# Patient Record
Sex: Female | Born: 1980 | Race: White | Hispanic: No | Marital: Married | Smoking: Current every day smoker
Health system: Southern US, Community
[De-identification: ages and names within clinical notes are randomized; demographics above are authoritative.]

## PROBLEM LIST (undated history)

## (undated) HISTORY — PX: NO PAST SURGERIES: SHX2092

---

## 2010-03-24 NOTE — L&D Delivery Note (Signed)
Delivery Note At  a viable female was delivered via  (Presentation: ROA  ).  Cord was clamped and cut and infant was placed on mother's abdomen.  Cord blood was sampled. APGAR: 9,9 ; weight .5lb13oz   Placenta status: , .  Good uterine firming with fundal massage and pitocin.  Cord:  with the following complications: .   Anesthesia: none   Episiotomy: none Lacerations:   superficial, hemostatic Est. Blood Loss (mL):  Mom to postpartum.  Baby to nursery-stable.  Lindaann Slough MD 11/01/2010, 3:21 PM

## 2010-08-02 ENCOUNTER — Other Ambulatory Visit: Payer: Self-pay | Admitting: Family

## 2010-08-02 ENCOUNTER — Other Ambulatory Visit: Payer: Self-pay | Admitting: Obstetrics & Gynecology

## 2010-08-02 ENCOUNTER — Encounter (INDEPENDENT_AMBULATORY_CARE_PROVIDER_SITE_OTHER): Payer: Medicaid Other

## 2010-08-02 ENCOUNTER — Other Ambulatory Visit (HOSPITAL_COMMUNITY)
Admission: RE | Admit: 2010-08-02 | Discharge: 2010-08-02 | Disposition: A | Discharge status: Home / Self Care | Payer: Medicaid Other | Source: Ambulatory Visit | Attending: Obstetrics & Gynecology | Admitting: Obstetrics & Gynecology

## 2010-08-02 DIAGNOSIS — Z3480 Encounter for supervision of other normal pregnancy, unspecified trimester: Secondary | ICD-10-CM | POA: Insufficient documentation

## 2010-08-02 DIAGNOSIS — Z113 Encounter for screening for infections with a predominantly sexual mode of transmission: Secondary | ICD-10-CM | POA: Insufficient documentation

## 2010-08-02 DIAGNOSIS — O0930 Supervision of pregnancy with insufficient antenatal care, unspecified trimester: Secondary | ICD-10-CM

## 2010-08-02 DIAGNOSIS — Z3689 Encounter for other specified antenatal screening: Secondary | ICD-10-CM

## 2010-08-02 LAB — ANTIBODY SCREEN
Antibody Screen: NEGATIVE
Antibody Screen: POSITIVE

## 2010-08-02 LAB — HIV ANTIBODY (ROUTINE TESTING W REFLEX): HIV: NONREACTIVE

## 2010-08-02 LAB — HEPATITIS B SURFACE ANTIGEN: Hepatitis B Surface Ag: NEGATIVE

## 2010-08-06 ENCOUNTER — Ambulatory Visit (HOSPITAL_COMMUNITY)
Admission: RE | Admit: 2010-08-06 | Discharge: 2010-08-06 | Disposition: A | Discharge status: Home / Self Care | Payer: Medicaid Other | Source: Ambulatory Visit | Attending: Obstetrics & Gynecology | Admitting: Obstetrics & Gynecology

## 2010-08-06 DIAGNOSIS — O0930 Supervision of pregnancy with insufficient antenatal care, unspecified trimester: Secondary | ICD-10-CM

## 2010-08-06 DIAGNOSIS — Z3689 Encounter for other specified antenatal screening: Secondary | ICD-10-CM

## 2010-08-16 ENCOUNTER — Encounter (INDEPENDENT_AMBULATORY_CARE_PROVIDER_SITE_OTHER): Payer: Medicaid Other

## 2010-08-16 DIAGNOSIS — O0930 Supervision of pregnancy with insufficient antenatal care, unspecified trimester: Secondary | ICD-10-CM

## 2010-08-30 ENCOUNTER — Encounter (INDEPENDENT_AMBULATORY_CARE_PROVIDER_SITE_OTHER): Payer: Medicaid Other

## 2010-08-30 DIAGNOSIS — O0930 Supervision of pregnancy with insufficient antenatal care, unspecified trimester: Secondary | ICD-10-CM

## 2010-09-13 ENCOUNTER — Encounter (INDEPENDENT_AMBULATORY_CARE_PROVIDER_SITE_OTHER): Payer: Medicaid Other

## 2010-09-13 DIAGNOSIS — Z3480 Encounter for supervision of other normal pregnancy, unspecified trimester: Secondary | ICD-10-CM

## 2010-09-27 ENCOUNTER — Encounter (INDEPENDENT_AMBULATORY_CARE_PROVIDER_SITE_OTHER): Payer: Medicaid Other

## 2010-09-27 ENCOUNTER — Other Ambulatory Visit: Payer: Self-pay | Admitting: Obstetrics & Gynecology

## 2010-09-27 DIAGNOSIS — Z3480 Encounter for supervision of other normal pregnancy, unspecified trimester: Secondary | ICD-10-CM

## 2010-09-27 DIAGNOSIS — O3660X0 Maternal care for excessive fetal growth, unspecified trimester, not applicable or unspecified: Secondary | ICD-10-CM

## 2010-10-01 ENCOUNTER — Ambulatory Visit (HOSPITAL_COMMUNITY)
Admission: RE | Admit: 2010-10-01 | Discharge: 2010-10-01 | Disposition: A | Discharge status: Home / Self Care | Payer: Medicaid Other | Source: Ambulatory Visit | Attending: Obstetrics & Gynecology | Admitting: Obstetrics & Gynecology

## 2010-10-01 ENCOUNTER — Encounter (HOSPITAL_COMMUNITY): Payer: Self-pay

## 2010-10-01 DIAGNOSIS — O99330 Smoking (tobacco) complicating pregnancy, unspecified trimester: Secondary | ICD-10-CM | POA: Insufficient documentation

## 2010-10-01 DIAGNOSIS — O0930 Supervision of pregnancy with insufficient antenatal care, unspecified trimester: Secondary | ICD-10-CM | POA: Insufficient documentation

## 2010-10-01 DIAGNOSIS — O365990 Maternal care for other known or suspected poor fetal growth, unspecified trimester, not applicable or unspecified: Secondary | ICD-10-CM | POA: Insufficient documentation

## 2010-10-01 DIAGNOSIS — O3660X0 Maternal care for excessive fetal growth, unspecified trimester, not applicable or unspecified: Secondary | ICD-10-CM

## 2010-10-11 ENCOUNTER — Other Ambulatory Visit: Payer: Self-pay | Admitting: Obstetrics and Gynecology

## 2010-10-11 ENCOUNTER — Encounter (INDEPENDENT_AMBULATORY_CARE_PROVIDER_SITE_OTHER): Payer: Medicaid Other

## 2010-10-11 DIAGNOSIS — Z3480 Encounter for supervision of other normal pregnancy, unspecified trimester: Secondary | ICD-10-CM

## 2010-10-12 LAB — GC/CHLAMYDIA PROBE AMP, GENITAL: GC Probe Amp, Genital: NEGATIVE

## 2010-10-13 LAB — STREP B DNA PROBE: GBSP: NEGATIVE

## 2010-10-18 ENCOUNTER — Encounter (INDEPENDENT_AMBULATORY_CARE_PROVIDER_SITE_OTHER): Payer: Medicaid Other

## 2010-10-18 DIAGNOSIS — Z3480 Encounter for supervision of other normal pregnancy, unspecified trimester: Secondary | ICD-10-CM

## 2010-10-25 ENCOUNTER — Encounter (INDEPENDENT_AMBULATORY_CARE_PROVIDER_SITE_OTHER): Payer: Medicaid Other

## 2010-10-25 DIAGNOSIS — Z3480 Encounter for supervision of other normal pregnancy, unspecified trimester: Secondary | ICD-10-CM

## 2010-11-01 ENCOUNTER — Encounter (INDEPENDENT_AMBULATORY_CARE_PROVIDER_SITE_OTHER): Payer: Medicaid Other

## 2010-11-01 ENCOUNTER — Inpatient Hospital Stay (HOSPITAL_COMMUNITY)
Admission: AD | Admit: 2010-11-01 | Discharge: 2010-11-03 | DRG: 767 | Disposition: A | Discharge status: Home / Self Care | Payer: Medicaid Other | Source: Ambulatory Visit | Attending: Family Medicine | Admitting: Family Medicine

## 2010-11-01 ENCOUNTER — Encounter (HOSPITAL_COMMUNITY): Payer: Self-pay

## 2010-11-01 DIAGNOSIS — O0940 Supervision of pregnancy with grand multiparity, unspecified trimester: Secondary | ICD-10-CM

## 2010-11-01 DIAGNOSIS — Z302 Encounter for sterilization: Secondary | ICD-10-CM

## 2010-11-01 DIAGNOSIS — Z3480 Encounter for supervision of other normal pregnancy, unspecified trimester: Secondary | ICD-10-CM

## 2010-11-01 LAB — CBC
MCH: 32.2 pg (ref 26.0–34.0)
MCHC: 34.5 g/dL (ref 30.0–36.0)
Platelets: 230 10*3/uL (ref 150–400)
RDW: 13.8 % (ref 11.5–15.5)

## 2010-11-01 LAB — RPR: RPR Ser Ql: NONREACTIVE

## 2010-11-01 MED ORDER — CITRIC ACID-SODIUM CITRATE 334-500 MG/5ML PO SOLN: 30.0000 mL | ORAL | Status: DC | PRN

## 2010-11-01 MED ORDER — PRENATAL PLUS 27-1 MG PO TABS
1.0000 | ORAL_TABLET | Freq: Every day | ORAL | Status: DC
  Administered 2010-11-03: 1 via ORAL
  Filled 2010-11-01: qty 1

## 2010-11-01 MED ORDER — SIMETHICONE 80 MG PO CHEW: 80.0000 mg | CHEWABLE_TABLET | ORAL | Status: DC | PRN

## 2010-11-01 MED ORDER — ONDANSETRON HCL 4 MG/2ML IJ SOLN: 4.0000 mg | INTRAMUSCULAR | Status: DC | PRN

## 2010-11-01 MED ORDER — OXYCODONE-ACETAMINOPHEN 5-325 MG PO TABS: 2.0000 | ORAL_TABLET | ORAL | Status: DC | PRN

## 2010-11-01 MED ORDER — DIPHENHYDRAMINE HCL 25 MG PO CAPS: 25.0000 mg | ORAL_CAPSULE | Freq: Four times a day (QID) | ORAL | Status: DC | PRN

## 2010-11-01 MED ORDER — BUTORPHANOL TARTRATE 2 MG/ML IJ SOLN
1.0000 mg | INTRAMUSCULAR | Status: DC | PRN
  Administered 2010-11-01 (×2): 1 mg via INTRAVENOUS
  Filled 2010-11-01 (×2): qty 1

## 2010-11-01 MED ORDER — OXYTOCIN BOLUS FROM INFUSION
500.0000 mL | Freq: Once | INTRAVENOUS | Status: DC
  Filled 2010-11-01: qty 500

## 2010-11-01 MED ORDER — NALOXONE HCL 0.4 MG/ML IJ SOLN
INTRAMUSCULAR | Status: AC
  Filled 2010-11-01: qty 1

## 2010-11-01 MED ORDER — BENZOCAINE-MENTHOL 20-0.5 % EX AERO: 1.0000 "application " | INHALATION_SPRAY | CUTANEOUS | Status: DC | PRN

## 2010-11-01 MED ORDER — OXYTOCIN BOLUS FROM INFUSION: 500.0000 mL | Freq: Once | INTRAVENOUS | Status: DC

## 2010-11-01 MED ORDER — FAMOTIDINE 20 MG PO TABS
40.0000 mg | ORAL_TABLET | Freq: Once | ORAL | Status: AC
  Administered 2010-11-02: 40 mg via ORAL
  Filled 2010-11-01 (×2): qty 1

## 2010-11-01 MED ORDER — DIBUCAINE 1 % RE OINT: 1.0000 "application " | TOPICAL_OINTMENT | RECTAL | Status: DC | PRN

## 2010-11-01 MED ORDER — IBUPROFEN 600 MG PO TABS: 600.0000 mg | ORAL_TABLET | Freq: Four times a day (QID) | ORAL | Status: DC | PRN

## 2010-11-01 MED ORDER — FAMOTIDINE 20 MG PO TABS: 40.0000 mg | ORAL_TABLET | Freq: Once | ORAL | Status: DC

## 2010-11-01 MED ORDER — LACTATED RINGERS IV SOLN: 500.0000 mL | INTRAVENOUS | Status: DC | PRN

## 2010-11-01 MED ORDER — SENNOSIDES-DOCUSATE SODIUM 8.6-50 MG PO TABS
2.0000 | ORAL_TABLET | Freq: Every day | ORAL | Status: DC
  Administered 2010-11-01 – 2010-11-02 (×2): 2 via ORAL

## 2010-11-01 MED ORDER — ONDANSETRON HCL 4 MG PO TABS: 4.0000 mg | ORAL_TABLET | ORAL | Status: DC | PRN

## 2010-11-01 MED ORDER — LACTATED RINGERS IV SOLN: INTRAVENOUS | Status: DC

## 2010-11-01 MED ORDER — OXYTOCIN 20 UNITS IN LACTATED RINGERS INFUSION - SIMPLE
125.0000 mL/h | INTRAVENOUS | Status: DC
  Filled 2010-11-01: qty 1000

## 2010-11-01 MED ORDER — ONDANSETRON HCL 4 MG/2ML IJ SOLN: 4.0000 mg | Freq: Four times a day (QID) | INTRAMUSCULAR | Status: DC | PRN

## 2010-11-01 MED ORDER — LACTATED RINGERS IV SOLN
INTRAVENOUS | Status: DC
  Administered 2010-11-02 (×2): via INTRAVENOUS

## 2010-11-01 MED ORDER — FLEET ENEMA 7-19 GM/118ML RE ENEM: 1.0000 | ENEMA | RECTAL | Status: DC | PRN

## 2010-11-01 MED ORDER — METOCLOPRAMIDE HCL 10 MG PO TABS
10.0000 mg | ORAL_TABLET | Freq: Once | ORAL | Status: AC
  Administered 2010-11-02: 10 mg via ORAL
  Filled 2010-11-01: qty 1

## 2010-11-01 MED ORDER — OXYCODONE-ACETAMINOPHEN 5-325 MG PO TABS
1.0000 | ORAL_TABLET | ORAL | Status: DC | PRN
  Administered 2010-11-01 – 2010-11-02 (×3): 1 via ORAL
  Administered 2010-11-02: 2 via ORAL
  Administered 2010-11-03 (×2): 1 via ORAL
  Filled 2010-11-01 (×2): qty 1
  Filled 2010-11-01: qty 2
  Filled 2010-11-01 (×3): qty 1

## 2010-11-01 MED ORDER — ACETAMINOPHEN 325 MG PO TABS: 650.0000 mg | ORAL_TABLET | ORAL | Status: DC | PRN

## 2010-11-01 MED ORDER — METOCLOPRAMIDE HCL 10 MG PO TABS: 10.0000 mg | ORAL_TABLET | Freq: Once | ORAL | Status: DC

## 2010-11-01 MED ORDER — LIDOCAINE HCL (PF) 1 % IJ SOLN: 30.0000 mL | INTRAMUSCULAR | Status: DC | PRN

## 2010-11-01 MED ORDER — IBUPROFEN 600 MG PO TABS
600.0000 mg | ORAL_TABLET | Freq: Four times a day (QID) | ORAL | Status: DC
  Administered 2010-11-01 – 2010-11-03 (×7): 600 mg via ORAL
  Filled 2010-11-01 (×8): qty 1

## 2010-11-01 MED ORDER — TETANUS-DIPHTH-ACELL PERTUSSIS 5-2.5-18.5 LF-MCG/0.5 IM SUSP: 0.5000 mL | Freq: Once | INTRAMUSCULAR | Status: DC

## 2010-11-01 MED ORDER — ZOLPIDEM TARTRATE 5 MG PO TABS: 5.0000 mg | ORAL_TABLET | Freq: Every evening | ORAL | Status: DC | PRN

## 2010-11-01 MED ORDER — WITCH HAZEL-GLYCERIN EX PADS: 1.0000 "application " | MEDICATED_PAD | CUTANEOUS | Status: DC | PRN

## 2010-11-01 MED ORDER — OXYTOCIN 20 UNITS IN LACTATED RINGERS INFUSION - SIMPLE: 125.0000 mL/h | INTRAVENOUS | Status: DC

## 2010-11-01 MED ORDER — LIDOCAINE HCL (PF) 1 % IJ SOLN
30.0000 mL | INTRAMUSCULAR | Status: DC | PRN
  Filled 2010-11-01 (×2): qty 30

## 2010-11-01 MED ORDER — LANOLIN HYDROUS EX OINT: TOPICAL_OINTMENT | CUTANEOUS | Status: DC | PRN

## 2010-11-01 NOTE — Plan of Care (Signed)
Problem: Phase II Progression Outcomes Goal: Initiate breastfeeding within 1hr delivery Outcome: Not Applicable Date Met:  11/01/10 Pt is bottle feeding infant

## 2010-11-01 NOTE — H&P (Signed)
Yvette Hull is a 30 y.o. female G3P2002 with IUP at [redacted]w[redacted]d presenting for active labor. Pt states she has been having regular, every 5 minutes, associated with scant staining vaginal bleeding, intact, with active.  She desires to unsure.  [facility] at [facility] since 26 wks  Prenatal History/Complications: Late to prenatal care  Past Medical History: No past medical history on file.  Past Surgical History: Past Surgical History  Procedure Date  . No past surgeries     Obstetrical History: OB History    Grav Para Term Preterm Abortions TAB SAB Ect Mult Living   3 2 2       2        Social History: History   Social History  . Marital Status: Single    Spouse Name: N/A    Number of Children: N/A  . Years of Education: N/A   Social History Main Topics  . Smoking status: Current Everyday Smoker -- 1.0 packs/day    Types: Cigarettes  . Smokeless tobacco: Not on file  . Alcohol Use: No  . Drug Use: No  . Sexually Active:    Other Topics Concern  . Not on file   Social History Narrative  . No narrative on file    Family History: No family history on file.  Allergies: No Known Allergies  No prescriptions prior to admission    Review of Systems - Negative except listed above in HPI   Blood pressure 136/102, pulse 103, temperature 98.5 F (36.9 C), temperature source Oral, resp. rate 16, height 5\' 4"  (1.626 m), weight 166 lb 6.4 oz (75.479 kg), SpO2 97.00%. General appearance: alert and no distress Head: Normocephalic, without obvious abnormality, atraumatic Lungs: clear to auscultation bilaterally Heart: regular rate and rhythm, S1, S2 normal, no murmur, click, rub or gallop Abdomen: gravid, nontender Extremities: extremities normal, atraumatic, no cyanosis or edema cephalic Baseline: 150 bpm Frequency: Every 5 minutes Dilation: 6 Effacement (%): 90 Station: -1 Exam by:: Dr Maple Hudson   Prenatal labs: ABO, Rh:  A neg Antibody:  neg Rubella:   immune RPR:   NR HBsAg:   neg HIV:   neg GBS: NEGATIVE (07/20 1322)  1 hr Glucola 95 Genetic screening declined Anatomy US normal   Assessment: Yvette Hull is a 30 y.o. [id number] with an IUP at [redacted]w[redacted]d presenting for active labor  Plan: Will admit to L+D, expectant management.  Plans on PP BTL for contraception  I have discussed this case with Dr Natale Milch who is in agreement with this case   Yvette Hull. MD8/12/2010, 12:51 PM

## 2010-11-01 NOTE — Progress Notes (Signed)
Pt states she was seen in the office this am and was 4-5 cm. Was told to come to MAU when contractions became stronger. States uc's now  q 5 minutes. Has a history of rapid labor with both previous babies.

## 2010-11-01 NOTE — Plan of Care (Signed)
Problem: Phase II Progression Outcomes Goal: Notify MD prior to epidural redose Outcome: Not Applicable Date Met:  11/01/10 Pt did not have epidural

## 2010-11-02 ENCOUNTER — Inpatient Hospital Stay (HOSPITAL_COMMUNITY): Payer: Medicaid Other | Admitting: Anesthesiology

## 2010-11-02 ENCOUNTER — Encounter (HOSPITAL_COMMUNITY)
Admission: AD | Disposition: A | Discharge status: Home / Self Care | Payer: Self-pay | Source: Ambulatory Visit | Attending: Family Medicine

## 2010-11-02 ENCOUNTER — Encounter (HOSPITAL_COMMUNITY): Payer: Self-pay | Admitting: Anesthesiology

## 2010-11-02 DIAGNOSIS — Z302 Encounter for sterilization: Secondary | ICD-10-CM

## 2010-11-02 HISTORY — PX: TUBAL LIGATION: SHX77

## 2010-11-02 LAB — SURGICAL PCR SCREEN
MRSA, PCR: NEGATIVE
Staphylococcus aureus: NEGATIVE

## 2010-11-02 SURGERY — LIGATION, FALLOPIAN TUBE, POSTPARTUM
Anesthesia: Choice | Site: Abdomen | Laterality: Bilateral | Wound class: Clean

## 2010-11-02 MED ORDER — BUPIVACAINE IN DEXTROSE 0.75-8.25 % IT SOLN
INTRATHECAL | Status: DC | PRN
Start: 2010-11-02 — End: 2010-11-02
  Administered 2010-11-02: 13 mg via INTRATHECAL

## 2010-11-02 MED ORDER — MEPERIDINE HCL 25 MG/ML IJ SOLN
INTRAMUSCULAR | Status: DC | PRN
  Administered 2010-11-02: 25 mg via INTRAVENOUS

## 2010-11-02 MED ORDER — FENTANYL CITRATE 0.05 MG/ML IJ SOLN
INTRAMUSCULAR | Status: AC
  Administered 2010-11-02: 50 ug via INTRAVENOUS
  Filled 2010-11-02: qty 2

## 2010-11-02 MED ORDER — MIDAZOLAM HCL 2 MG/2ML IJ SOLN
INTRAMUSCULAR | Status: AC
  Filled 2010-11-02: qty 2

## 2010-11-02 MED ORDER — KETOROLAC TROMETHAMINE 30 MG/ML IJ SOLN
INTRAMUSCULAR | Status: AC
  Filled 2010-11-02: qty 1

## 2010-11-02 MED ORDER — MEPERIDINE HCL 25 MG/ML IJ SOLN
INTRAMUSCULAR | Status: AC
  Filled 2010-11-02: qty 1

## 2010-11-02 MED ORDER — METOCLOPRAMIDE HCL 5 MG/ML IJ SOLN: 10.0000 mg | Freq: Once | INTRAMUSCULAR | Status: AC | PRN

## 2010-11-02 MED ORDER — KETOROLAC TROMETHAMINE 30 MG/ML IJ SOLN
INTRAMUSCULAR | Status: DC | PRN
  Administered 2010-11-02: 30 mg via INTRAVENOUS

## 2010-11-02 MED ORDER — BUPIVACAINE HCL (PF) 0.25 % IJ SOLN
INTRAMUSCULAR | Status: DC | PRN
  Administered 2010-11-02: 10 mL

## 2010-11-02 MED ORDER — MIDAZOLAM HCL 5 MG/5ML IJ SOLN
INTRAMUSCULAR | Status: DC | PRN
  Administered 2010-11-02 (×2): 1 mg via INTRAVENOUS

## 2010-11-02 MED ORDER — FENTANYL CITRATE 0.05 MG/ML IJ SOLN
25.0000 ug | INTRAMUSCULAR | Status: DC | PRN
  Administered 2010-11-02 (×2): 50 ug via INTRAVENOUS

## 2010-11-02 MED ORDER — ONDANSETRON HCL 4 MG/2ML IJ SOLN: INTRAMUSCULAR | Status: DC | PRN

## 2010-11-02 MED ORDER — FENTANYL CITRATE 0.05 MG/ML IJ SOLN
INTRAMUSCULAR | Status: AC
  Filled 2010-11-02: qty 5

## 2010-11-02 MED ORDER — FENTANYL CITRATE 0.05 MG/ML IJ SOLN
INTRAMUSCULAR | Status: DC | PRN
  Administered 2010-11-02 (×3): 50 ug via INTRAVENOUS

## 2010-11-02 SURGICAL SUPPLY — 30 items
APL SKNCLS STERI-STRIP NONHPOA (GAUZE/BANDAGES/DRESSINGS)
BENZOIN TINCTURE PRP APPL 2/3 (GAUZE/BANDAGES/DRESSINGS) IMPLANT
CHLORAPREP W/TINT 26ML (MISCELLANEOUS) ×2 IMPLANT
CLIP FILSHIE TUBAL LIGA STRL (Clip) ×2 IMPLANT
CLOTH BEACON ORANGE TIMEOUT ST (SAFETY) ×2 IMPLANT
DRSG COVADERM PLUS 2X2 (GAUZE/BANDAGES/DRESSINGS) ×1 IMPLANT
ELECT REM PT RETURN 9FT ADLT (ELECTROSURGICAL) ×2
ELECTRODE REM PT RTRN 9FT ADLT (ELECTROSURGICAL) IMPLANT
GLOVE BIO SURGEON STRL SZ7 (GLOVE) ×2 IMPLANT
GLOVE BIOGEL PI IND STRL 7.0 (GLOVE) ×2 IMPLANT
GLOVE BIOGEL PI INDICATOR 7.0 (GLOVE) ×2
GLOVE SKINSENSE NS SZ6.5 (GLOVE) ×2
GLOVE SKINSENSE STRL SZ6.5 (GLOVE) IMPLANT
GOWN PREVENTION PLUS LG XLONG (DISPOSABLE) ×4 IMPLANT
GOWN STRL REIN XL XLG (GOWN DISPOSABLE) ×3 IMPLANT
NDL HYPO 25X1 1.5 SAFETY (NEEDLE) ×1 IMPLANT
NEEDLE HYPO 25X1 1.5 SAFETY (NEEDLE) ×2 IMPLANT
NS IRRIG 1000ML POUR BTL (IV SOLUTION) ×2 IMPLANT
PACK ABDOMINAL MINOR (CUSTOM PROCEDURE TRAY) ×2 IMPLANT
PENCIL BUTTON HOLSTER BLD 10FT (ELECTRODE) ×2 IMPLANT
SPONGE LAP 4X18 X RAY DECT (DISPOSABLE) ×1 IMPLANT
STRIP CLOSURE SKIN 1/2X4 (GAUZE/BANDAGES/DRESSINGS) IMPLANT
SUT CHROMIC 2 0 TIES 18 (SUTURE) IMPLANT
SUT VIC AB 0 CT1 27 (SUTURE) ×2
SUT VIC AB 0 CT1 27XBRD ANBCTR (SUTURE) ×1 IMPLANT
SUT VICRYL 4-0 PS2 18IN ABS (SUTURE) ×2 IMPLANT
SYR CONTROL 10ML LL (SYRINGE) ×2 IMPLANT
TOWEL OR 17X24 6PK STRL BLUE (TOWEL DISPOSABLE) ×3 IMPLANT
TRAY FOLEY CATH 14FR (SET/KITS/TRAYS/PACK) ×2 IMPLANT
WATER STERILE IRR 1000ML POUR (IV SOLUTION) ×1 IMPLANT

## 2010-11-02 NOTE — Progress Notes (Signed)
Post Partum Day 1 Subjective: no complaints, up ad lib, voiding and NPO for BTL  Objective: Blood pressure 130/82, pulse 88, temperature 98.8 F (37.1 C), temperature source Oral, resp. rate 18, height 5\' 4"  (1.626 m), weight 75.479 kg (166 lb 6.4 oz), SpO2 97.00%, unknown if currently breastfeeding.  Physical Exam:  General: alert, cooperative and appears stated age Lochia: appropriate Uterine Fundus: firm  DVT Evaluation: No evidence of DVT seen on physical exam.   Basename 11/01/10 1315  HGB 12.9  HCT 37.4    Assessment/Plan: Plan for discharge tomorrow and Contraception BTL. Bottle feeding. Patient counseled, r.e. Risks benefits of BTL, including permanency of procedure, risk of failure(1:100), increased risk of ectopic.  Patient verbalized understanding and desires to proceed.   LOS: 1 day   Charlesa Ehle S 11/02/2010, 7:48 AM

## 2010-11-02 NOTE — Transfer of Care (Signed)
Immediate Anesthesia Transfer of Care Note  Patient: Yvette Hull  Procedure(s) Performed:  POST PARTUM TUBAL LIGATION - Bilateral tubal ligation with filshie clips  Patient Location: PACU  Anesthesia Type: Spinal  Level of Consciousness: awake, alert  and oriented  Airway & Oxygen Therapy: Patient Spontanous Breathing  Post-op Assessment: Report given to PACU RN and Post -op Vital signs reviewed and stable  Post vital signs: Reviewed and stable  Complications: No apparent anesthesia complications

## 2010-11-02 NOTE — Op Note (Signed)
Essence E Nawrot 11/02/2010  PREOPERATIVE DIAGNOSIS:  Undesired fertility, multiparity  POSTOPERATIVE DIAGNOSIS:  Undesired fertility, multiparity  PROCEDURE:  Postpartum Bilateral Tubal Sterilization using Filshie Clips   SURGEON: Dr. Jaynie Collins, Dr. Maryelizabeth Kaufmann  ANESTHESIA:  Spinal  COMPLICATIONS:  None immediate.  ESTIMATED BLOOD LOSS:  Less than 20cc.  FLUIDS: 1000 cc LR.  URINE OUTPUT:  150 cc of clear urine.  INDICATIONS: 30 y.o. yo [id number]  with undesired fertility,status post vaginal delivery, desires permanent sterilization. Risks and benefits of procedure discussed with patient including permanence of method, bleeding, infection, injury to surrounding organs and need for additional procedures. Risk failure of 0.5-1% with increased risk of ectopic gestation if pregnancy occurs was also discussed with patient.   FINDINGS:  Normal uterus, tubes, and ovaries.  TECHNIQUE:  The patient was taken to the operating room where her spinal anesthesia was dosed up to surgical level and found to be adequate.  She was then placed in the dorsal supine position and prepped and draped in sterile fashion.  After an adequate timeout was performed, attention was turned to the patient's abdomen where a small transverse skin incision was made under the umbilical fold. The incision was taken down to the layer of fascia using the scalpel, and fascia was incised, and extended bilaterally using Mayo scissors. The peritoneum was entered in a sharp fashion. Attention was then turned to the patient's uterus, and left fallopian tube was identified and followed out to the fimbriated end.  A Filshie clip was placed on the left fallopian tube about 2 cm from the cornual attachment, with care given to incorporate the underlying mesosalpinx.  A similar process was carried out on the rightl side allowing for bilateral tubal sterilization.  Good hemostasis was noted overall.  Local analgesia was drizzled on  both operative sites.The instruments were then removed from the patient's abdomen and the fascial incision was repaired with 0 Vicryl, and the skin was closed with a 4-0 Vicryl subcuticular stitch. The patient tolerated the procedure well.  Sponge, lap, and needle counts were correct times two.  The patient was then taken to the recovery room awake, extubated and in stable condition.  Disposition: PACU in stable condition  Signed:  Aliahna Statzer 9:54 AM 11/02/2010

## 2010-11-02 NOTE — Anesthesia Preprocedure Evaluation (Signed)
Anesthesia Evaluation  General Assessment Comment  Reviewed: Allergy & Precautions, H&P , Patient's Chart, lab work & pertinent test results  Airway Mallampati: III TM Distance: >3 FB Neck ROM: full    Dental No notable dental hx. (+) Teeth Intact and Chipped   Pulmonary  clear to auscultation  pulmonary exam normalPulmonary Exam Normal breath sounds clear to auscultation none    Cardiovascular     Neuro/Psych Negative Neurological ROS  Negative Psych ROS  GI/Hepatic/Renal negative GI ROS, negative Liver ROS, and negative Renal ROS (+)  GERD      Endo/Other  Negative Endocrine ROS (+)      Abdominal   Musculoskeletal   Hematology negative hematology ROS (+)   Peds  Reproductive/Obstetrics negative OB ROS    Anesthesia Other Findings             Anesthesia Physical Anesthesia Plan  ASA: II  Anesthesia Plan: Spinal   Post-op Pain Management:    Induction:   Airway Management Planned:   Additional Equipment:   Intra-op Plan:   Post-operative Plan:   Informed Consent: I have reviewed the patients History and Physical, chart, labs and discussed the procedure including the risks, benefits and alternatives for the proposed anesthesia with the patient or authorized representative who has indicated his/her understanding and acceptance.   Dental Advisory Given  Plan Discussed with: Anesthesiologist  Anesthesia Plan Comments:         Anesthesia Quick Evaluation

## 2010-11-02 NOTE — Anesthesia Procedure Notes (Addendum)
Spinal Block  Start time: 11/02/2010 9:12 AM Staffing Anesthesiologist: FOSTER, MICHAEL A. Performed by: anesthesiologist  Preanesthetic Checklist Completed: patient identified, site marked, surgical consent, pre-op evaluation, timeout performed, IV checked, risks and benefits discussed and monitors and equipment checked Spinal Block Patient position: sitting Prep: site prepped and draped and DuraPrep Patient monitoring: continuous pulse ox and blood pressure Approach: midline Location: L3-4 Injection technique: single-shot Needle Needle type: Sprotte  Needle gauge: 24 G Needle length: 9 cm Needle insertion depth: 4 cm Assessment Sensory level: T6 Additional Notes Pt.tolerated procedure well. Adequate surgical anesthesia.

## 2010-11-02 NOTE — Anesthesia Postprocedure Evaluation (Signed)
  Anesthesia Post-op Note  Patient: Yvette Hull  Procedure(s) Performed:  POST PARTUM TUBAL LIGATION - Bilateral tubal ligation with filshie clips  Patient Location: PACU  Anesthesia Type: Spinal  Level of Consciousness: awake, alert  and oriented  Airway and Oxygen Therapy: Patient Spontanous Breathing  Post-op Pain: none  Post-op Assessment: Post-op Vital signs reviewed, Patient's Cardiovascular Status Stable, Respiratory Function Stable, Patent Airway, No signs of Nausea or vomiting, Pain level controlled, No headache, No backache, No residual numbness and No residual motor weakness  Post-op Vital Signs: Reviewed and stable  Complications: No apparent anesthesia complications

## 2010-11-03 LAB — CBC
MCH: 31.8 pg (ref 26.0–34.0)
MCHC: 33.6 g/dL (ref 30.0–36.0)
MCV: 94.8 fL (ref 78.0–100.0)
Platelets: 179 10*3/uL (ref 150–400)

## 2010-11-03 MED ORDER — DOCUSATE SODIUM 100 MG PO CAPS: 100.0000 mg | ORAL_CAPSULE | Freq: Two times a day (BID) | ORAL | Status: AC | PRN

## 2010-11-03 MED ORDER — OXYCODONE-ACETAMINOPHEN 5-325 MG PO TABS: 1.0000 | ORAL_TABLET | ORAL | Status: AC | PRN

## 2010-11-03 MED ORDER — IBUPROFEN 600 MG PO TABS: 600.0000 mg | ORAL_TABLET | Freq: Four times a day (QID) | ORAL | Status: AC

## 2010-11-03 NOTE — Discharge Summary (Signed)
Obstetric Discharge Summary Reason for Admission: onset of labor Prenatal Procedures: none Intrapartum Procedures: spontaneous vaginal delivery Postpartum Procedures: P.P. tubal ligation Complications-Operative and Postpartum: none Hemoglobin  Date Value Range Status  11/03/2010 10.3* 12.0-15.0 (g/dL) Final     DELTA CHECK NOTED     REPEATED TO VERIFY     HCT  Date Value Range Status  11/03/2010 30.7* 36.0-46.0 (%) Final    Discharge Diagnoses: Term Pregnancy-delivered and s/p pp [facility] course: pt admitted for labor.  S/p SVD viable female.  No complications.  Pp BTL on PPD #1.  No complications postoperatively.  hgb stable.  Pt d/c'd home in stable condition.   Discharge Information: Date: 11/03/2010 Activity: pelvic rest Diet: routine Medications: PNV, Ibuprophen, Colace and Percocet Condition: stable Instructions: refer to practice specific booklet Discharge to: home Follow-up Information    Follow up with [facility]. Make an appointment in 6 weeks. (postpartum check)    Contact information:   [address] [place] [address]          Newborn Data: Live born female  Birth Weight: 5 lb 13 oz (2637 g) APGAR: 9, 9  Home with mother.  Anik Wesch 11/03/2010, 6:57 AM

## 2010-11-03 NOTE — Progress Notes (Signed)
Post Partum Day 2/POD#1 s/p SVD/BTL respectively Subjective: no complaints and tolerating PO, thin lochia, absent BM, present flatus, plans to bottle feed, s/p BTL  Objective: Blood pressure 133/84, pulse 66, temperature 97.9 F (36.6 C), temperature source Oral, resp. rate 16, height 5\' 4"  (1.626 m), weight 75.479 kg (166 lb 6.4 oz), SpO2 98.00%, unknown if currently breastfeeding.  Physical Exam:  General: alert and cooperative Lochia: appropriate Chest: CTAB Heart: RRR no m/r/g Abdomen: +BS, soft, nontender,  Uterine Fundus: FF @ umbilicus, incision c/d/i healing well without signs of infection DVT Evaluation: No evidence of DVT seen on physical exam. Extremities:no c/c/e    [facility] 11/03/10 0520 11/01/10 1315  HGB 10.3* 12.9  HCT 30.7* 37.4    Assessment/Plan: Discharge home   LOS: 2 days   Yvette Hull 11/03/2010, 6:50 AM

## 2010-11-04 NOTE — Progress Notes (Signed)
UR chart review completed.  

## 2010-11-28 ENCOUNTER — Encounter (HOSPITAL_COMMUNITY): Payer: Self-pay | Admitting: Obstetrics & Gynecology

## 2011-07-20 IMAGING — US US OB DETAIL+14 WK
2 series · 12 of 28 positions shown · non-contrast
Comparison: none

[Series 1: us ob detail +14 wk · 1 of 6 slices shown (1 of 2)]
[im 6/6]
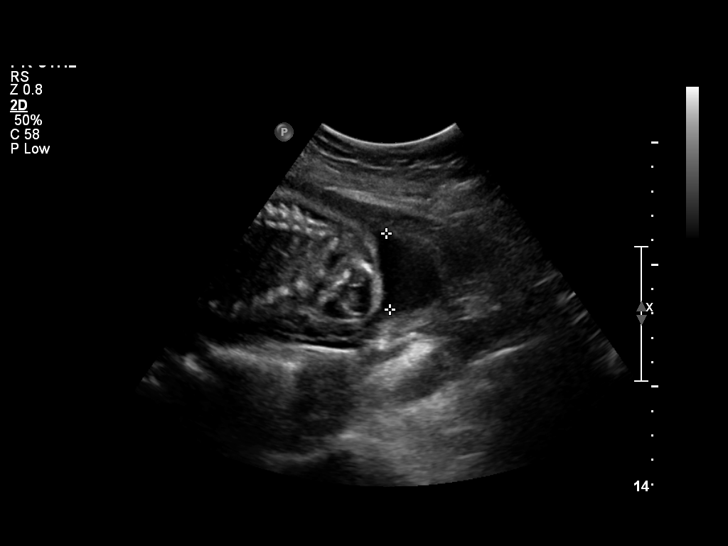

[Series 1: us ob detail +14 wk · 11 of 87 slices shown (2 of 2)]
[im 4/87]
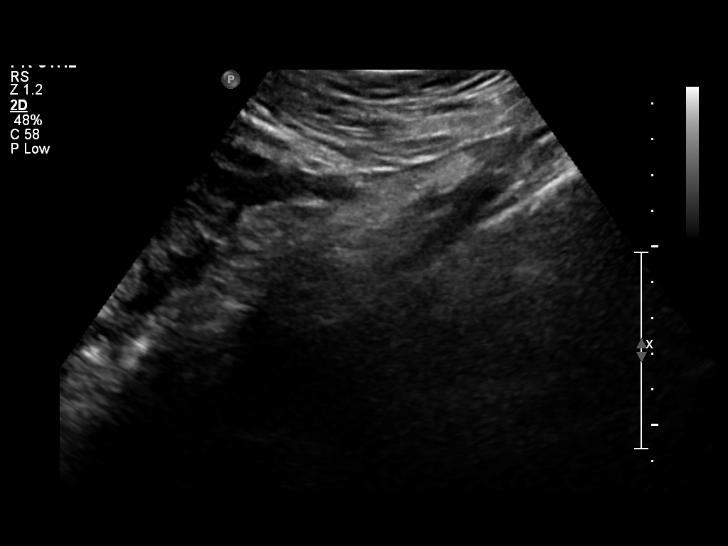
[im 11/87]
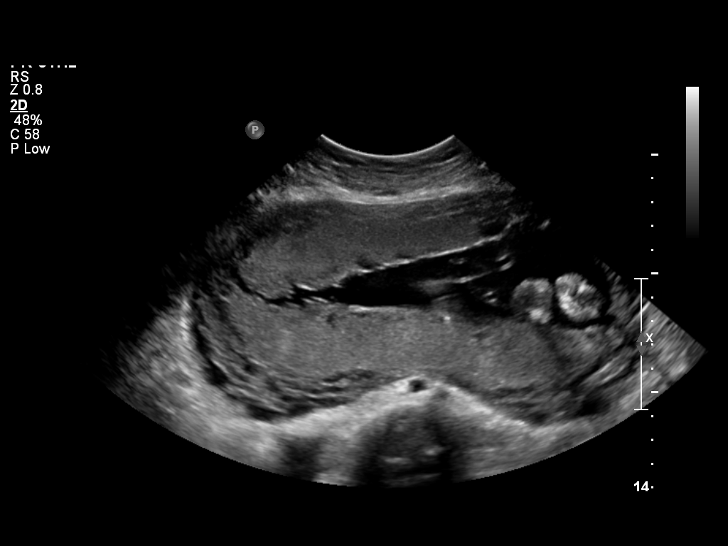
[im 21/87]
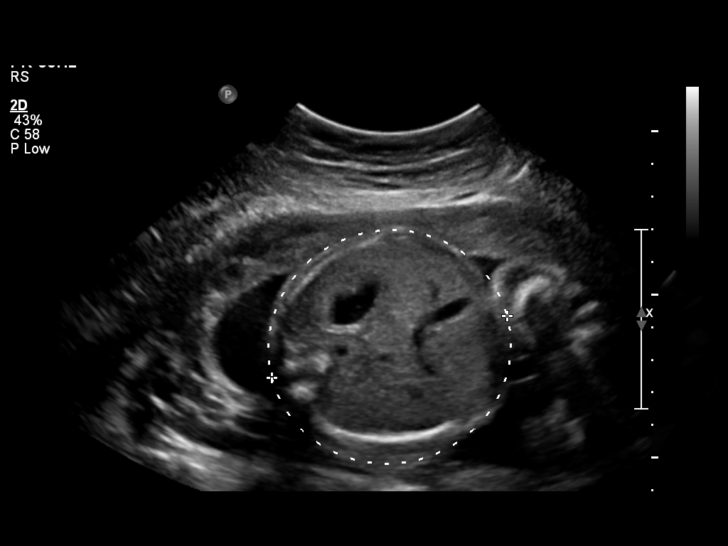
[im 28/87]
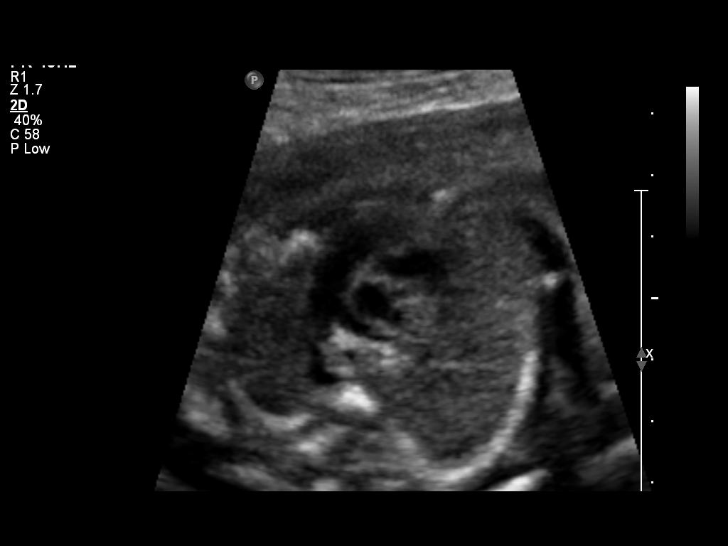
[im 35/87]
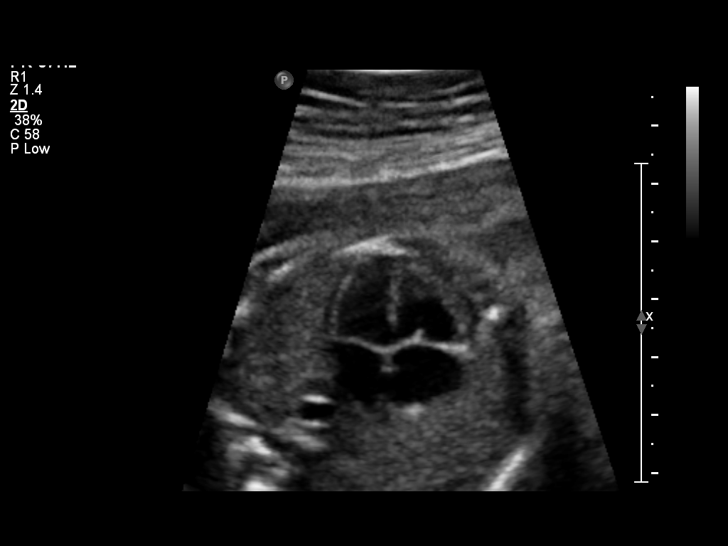
[im 45/87]
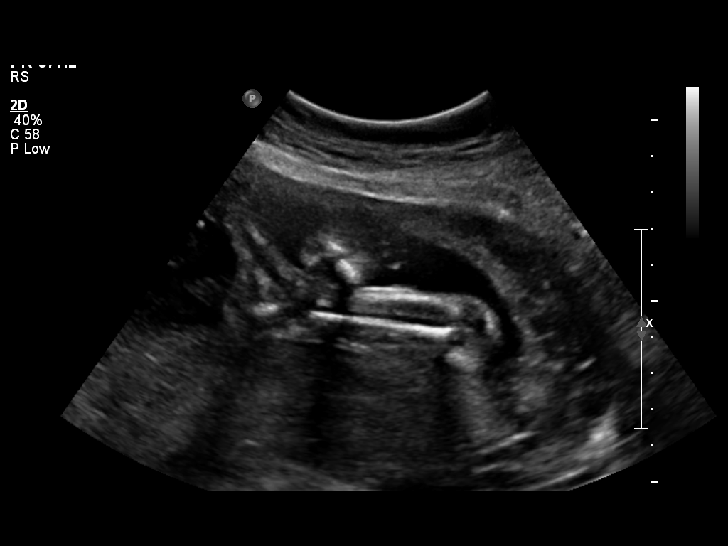
[im 52/87]
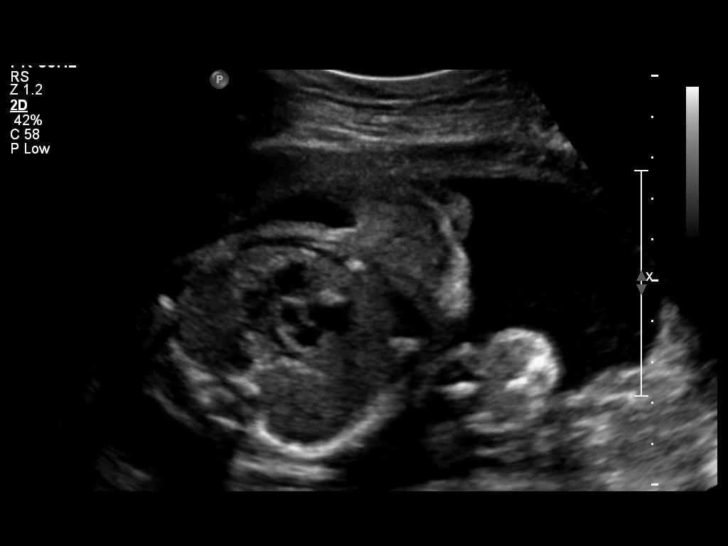
[im 59/87]
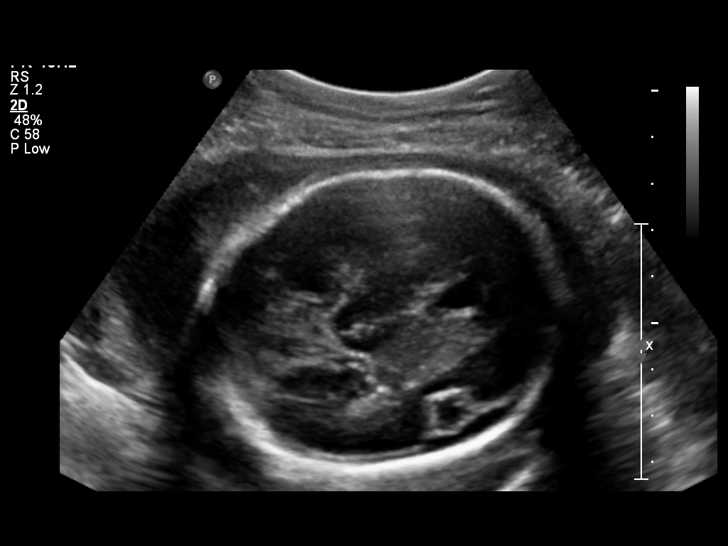
[im 69/87]
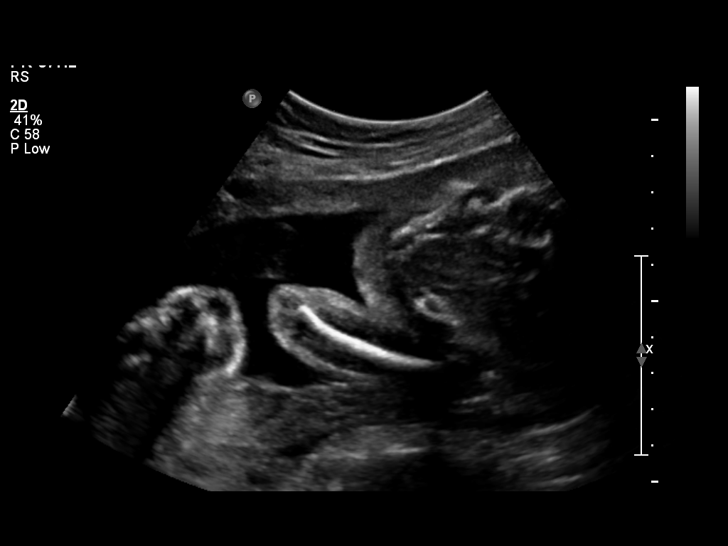
[im 76/87]
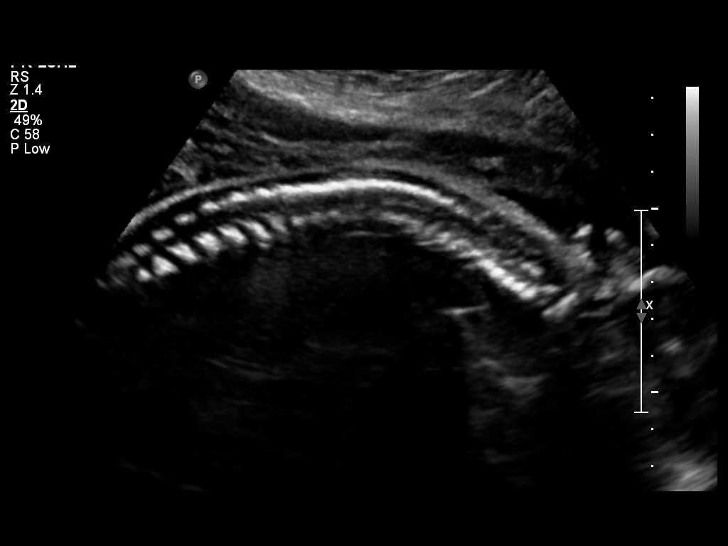
[im 83/87]
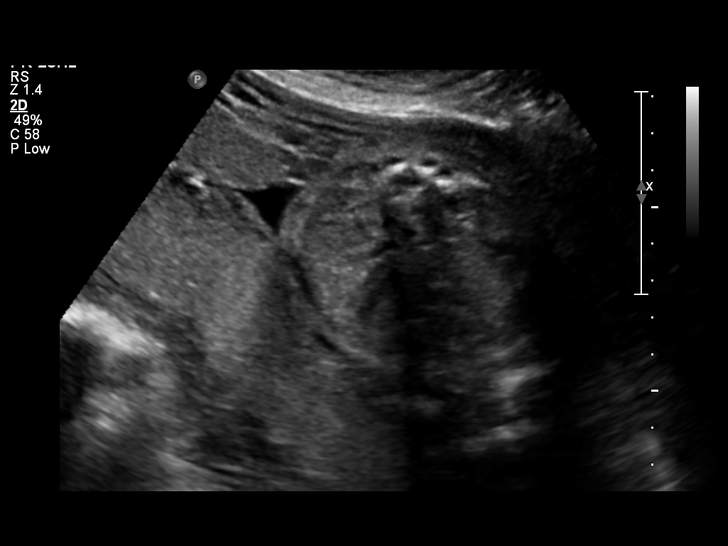

[12 of 28 positions shown; findings below may reference images not displayed]

OBSTETRICS REPORT
                      (Signed Final 08/06/2010 [DATE])

 Order#:         [id number]
Procedures

 US OB DETAIL + 14 WK                                  76811.0
Indications

 No or Little Prenatal Care
 Uncertain LMP;  Establish Gestational [AGE]
Fetal Evaluation

 Fetal Heart Rate:  147                          bpm
 Cardiac Activity:  Observed
 Presentation:      Cephalic
 Placenta:          Posterior Fundal, above
                    cervical os
 P. Cord            Visualized
 Insertion:

 Amniotic Fluid
 AFI FV:      Subjectively within normal limits
 AFI Sum:     12.63   cm       32  %Tile     Larg Pckt:    3.57  cm
 RUQ:   2.76    cm   RLQ:    3.11   cm    LUQ:   3.19    cm   LLQ:    3.57   cm
Biometry

 BPD:     62.7  mm     G. Age:  25w 3d                CI:        72.03   70 - 86
                                                      FL/HC:      20.8   18.6 -

 HC:     235.1  mm     G. Age:  25w 4d       11  %    HC/AC:      1.04   1.04 -

 AC:     226.7  mm     G. Age:  27w 0d       69  %    FL/BPD:     78.0   71 - 87
 FL:      48.9  mm     G. Age:  26w 3d       46  %    FL/AC:      21.6   20 - 24
 HUM:     44.8  mm     G. Age:  26w 4d       56  %

 Est. FW:     957  gm      2 lb 2 oz     63  %
Gestational Age

 LMP:           28w 0d        Date:  01/22/10                 EDD:   10/29/10
 U/S Today:     26w 1d                                        EDD:   11/11/10
 Best:          26w 1d     Det. By:  U/S (08/06/10)           EDD:   11/11/10
Anatomy

 Cranium:           Appears normal      Aortic Arch:       Appears normal
 Fetal Cavum:       Appears normal      Ductal Arch:       Appears normal
 Ventricles:        Appears normal      Diaphragm:         Appears normal
 Choroid Plexus:    Appears normal      Stomach:           Appears
                                                           normal, left
                                                           sided
 Cerebellum:        Appears normal      Abdomen:           Appears normal
 Posterior Fossa:   Appears normal      Abdominal Wall:    Appears nml
                                                           (cord insert,
                                                           abd wall)
 Nuchal Fold:       Not applicable      Cord Vessels:      Appears normal
                    (>20 wks GA)                           (3 vessel cord)
 Face:              Appears normal      Kidneys:           Appear normal
                    (lips/profile/orbit
                    s)
 Heart:             Appears normal      Bladder:           Appears normal
                    (4 chamber &
                    axis)
 RVOT:              Appears normal      Spine:             Appears normal
 LVOT:              Appears normal      Limbs:             Four extremities
                                                           seen

 Other:     Heels visualized. Technically difficult due to advanced
            GA and fetal position.
Cervix Uterus Adnexa

 Cervical Length:    3.3      cm

 Cervix:       Closed. Normal appearance by transabdominal scan.
 Left Ovary:    Within normal limits.
 Right Ovary:   Within normal limits.
 Adnexa:     No abnormality visualized.
Impression

 SIUP with an EGA by US of 26w 1d. Fetal parameters
 correlate well with this composite EGA.

 Visualized fetal anatomy appears normal. No focal placental
 abnormalities are noted.

 Subjectively and quantitatively normal amniotic fluid volume
 and normal cervical length.

## 2011-09-14 IMAGING — US US OB FOLLOW-UP
1 series · 12 of 28 positions shown · non-contrast
Comparison: none

[Series 1: us ob follow up · 12 of 42 slices shown]
[im 2/42]
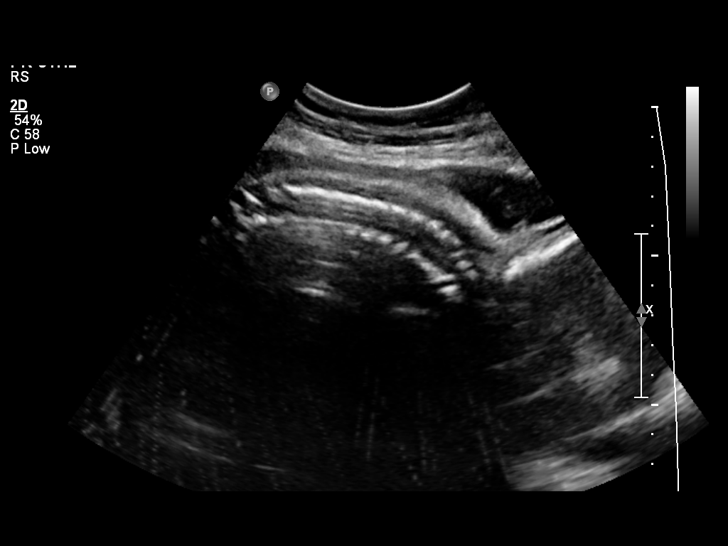
[im 5/42]
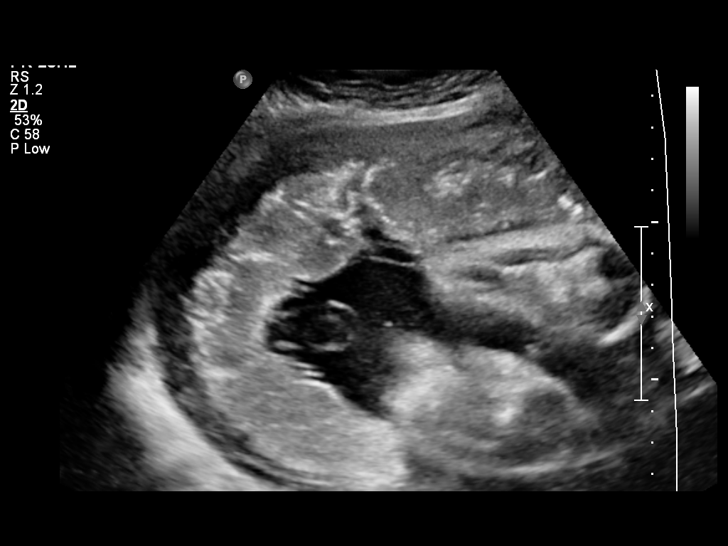
[im 8/42]
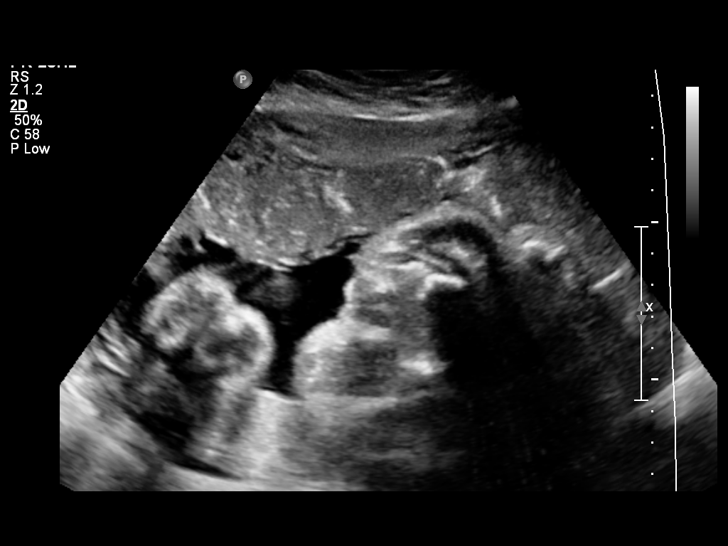
[im 13/42]
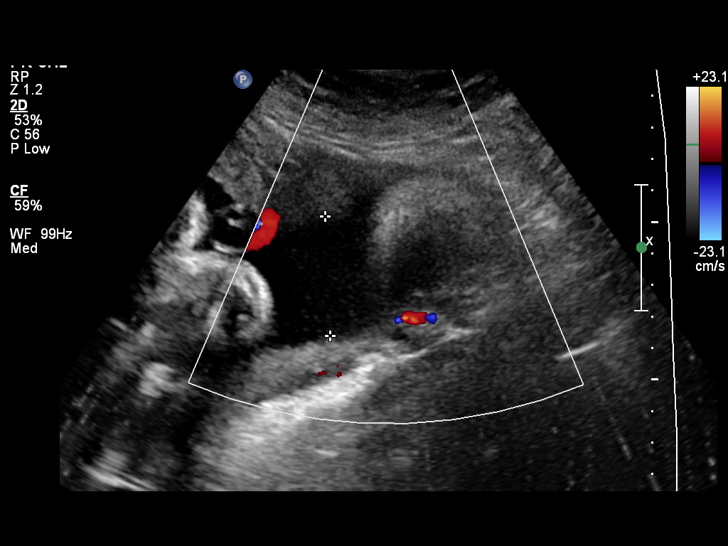
[im 16/42]
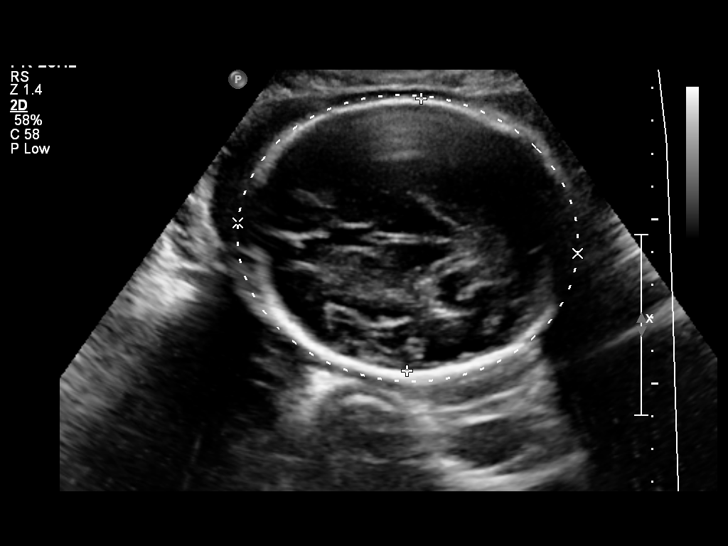
[im 19/42]
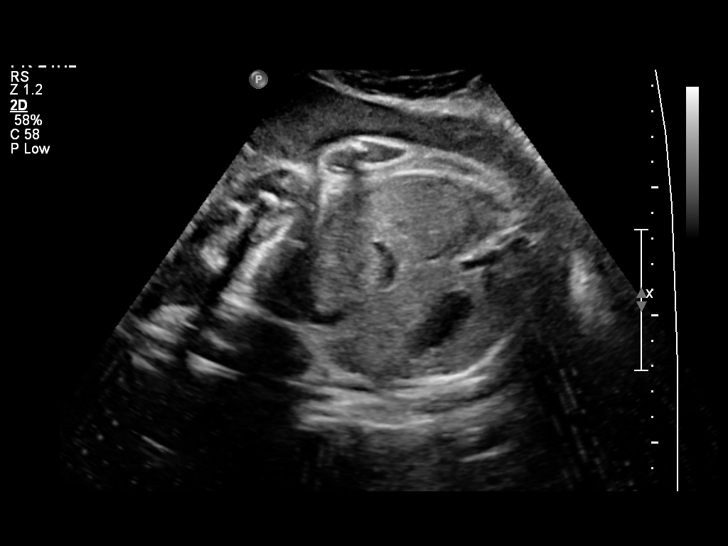
[im 23/42]
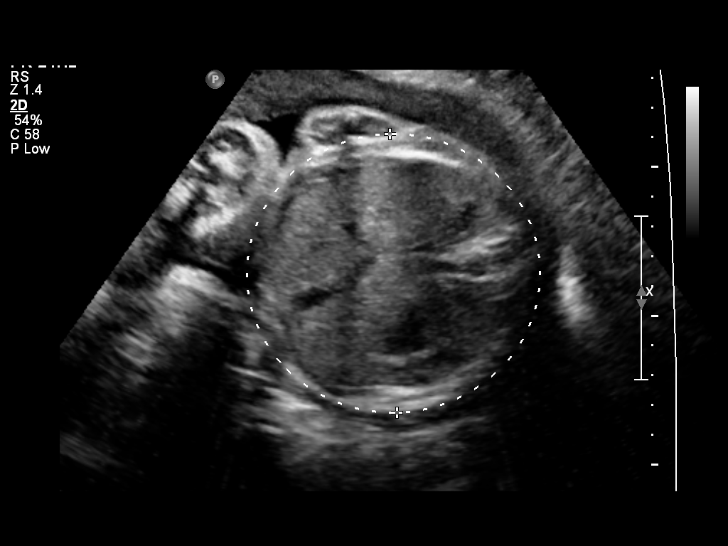
[im 26/42]
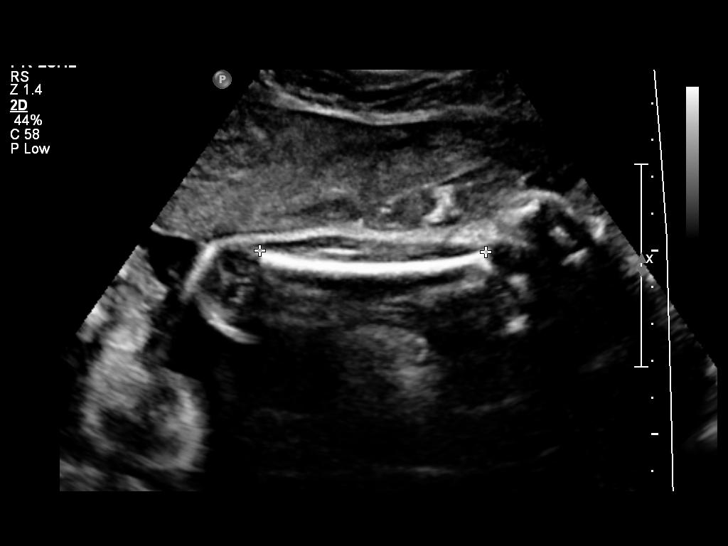
[im 29/42]
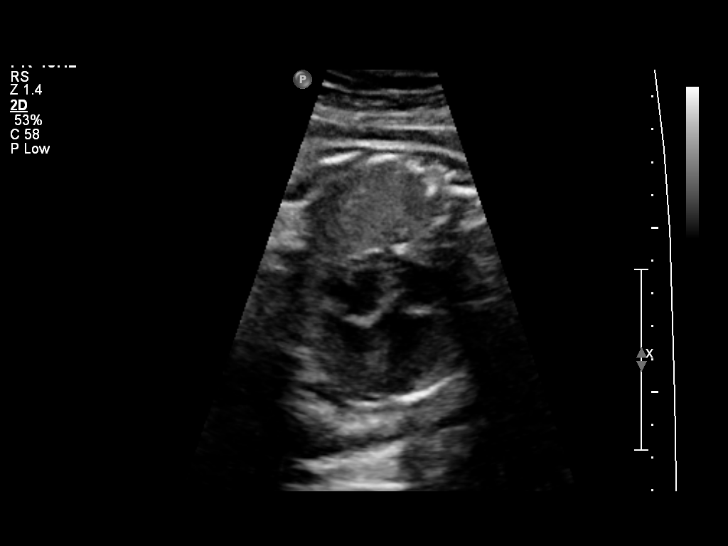
[im 34/42]
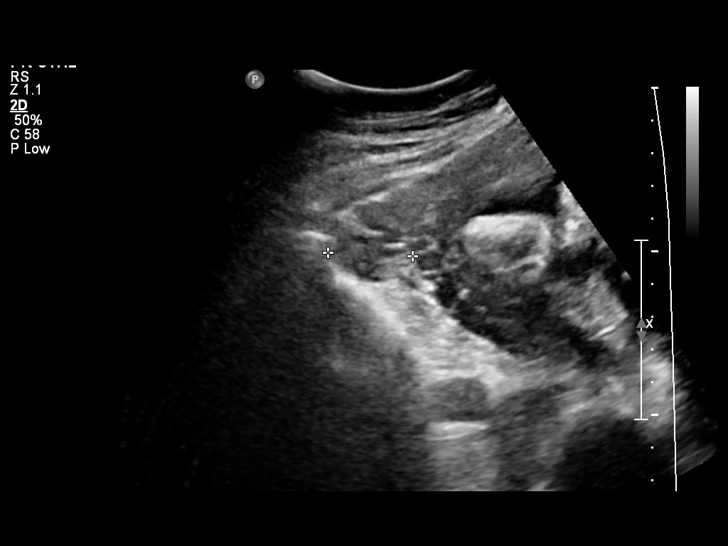
[im 37/42]
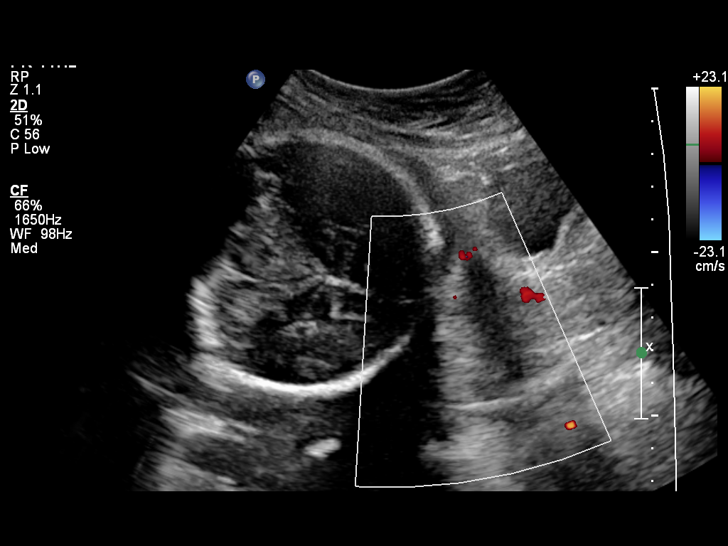
[im 40/42]
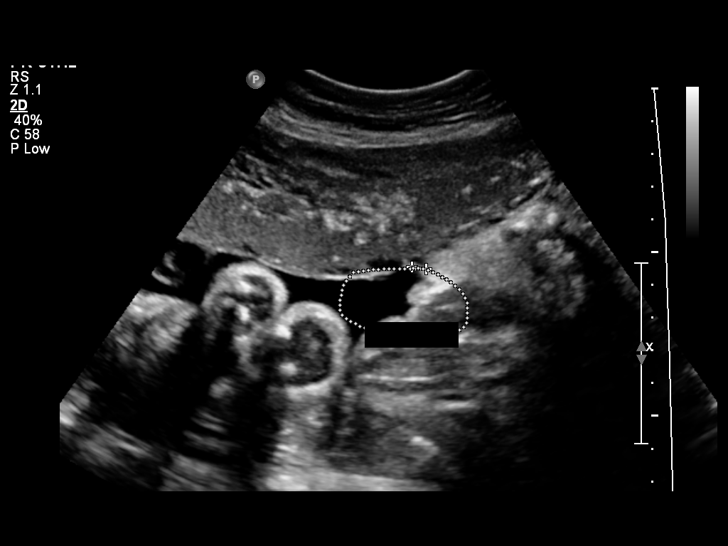

[12 of 28 positions shown; findings below may reference images not displayed]

OBSTETRICS REPORT
                      (Signed Final 10/01/2010 [DATE])

 Order#:         [id number]
Procedures

 US OB FOLLOW UP                                       76816.1
Indications

 Assess Fetal Growth / Estimated Fetal Weight
 Size less than dates (Small for gestational [AGE]
 FGR)
 Cigarette smoker
 No or Little Prenatal Care
Fetal Evaluation

 Fetal Heart Rate:  158                          bpm
 Cardiac Activity:  Observed
 Presentation:      Cephalic
 Placenta:          Posterior Fundal, above
                    cervical os
 P. Cord            Previously Visualized
 Insertion:

 Amniotic Fluid
 AFI FV:      Subjectively low-normal
 AFI Sum:     12.53   cm       38  %Tile     Larg Pckt:    3.78  cm
 RUQ:   2.61    cm   RLQ:    2.77   cm    LUQ:   3.37    cm   LLQ:    3.78   cm
Biometry

 BPD:     82.6  mm     G. Age:  33w 2d                CI:        75.96   70 - 86
                                                      FL/HC:      20.4   19.4 -

 HC:     300.4  mm     G. Age:  33w 2d        6  %    HC/AC:      1.00   0.96 -

 AC:       300  mm     G. Age:  34w 0d       49  %    FL/BPD:     74.1   71 - 87
 FL:      61.2  mm     G. Age:  31w 5d      < 3  %    FL/AC:      20.4   20 - 24

 Est. FW:    1966  gm    4 lb 12 oz      41  %
Gestational Age

 LMP:           36w 0d        Date:  01/22/10                 EDD:   10/29/10
 U/S Today:     33w 1d                                        EDD:   11/18/10
 Best:          34w 1d     Det. By:  U/S (08/06/10)           EDD:   11/11/10
Anatomy

 Cranium:           Appears normal      Aortic Arch:       Previously seen
 Fetal Cavum:       Previously seen     Ductal Arch:       Previously seen
 Ventricles:        Appears normal      Diaphragm:         Previously seen
 Choroid Plexus:    Previously seen     Stomach:           Appears
                                                           normal, left
                                                           sided
 Cerebellum:        Previously seen     Abdomen:           Appears normal
 Posterior Fossa:   Previously seen     Abdominal Wall:    Previously seen
 Nuchal Fold:       Not applicable      Cord Vessels:      Previously seen
                    (>20 wks GA)
 Face:              Previously seen     Kidneys:           Appear normal
 Heart:             Not well            Bladder:           Previously seen
                    visualized
                    today. Seen
                    previously.
 RVOT:              Previously seen     Spine:             Previously seen
 LVOT:              Previously seen     Limbs:             Four extremities
                                                           previously seen

 Other:     Fetus appears to be a female. Heels previously
            visualized.
Cervix Uterus Adnexa

 Cervix:       Not visualized (advanced GA >34 wks)

 Left Ovary:    Previously seen.
 Right Ovary:   Within normal limits.
 Adnexa:     No abnormality visualized.
Impression

 Single intrauterine gestation demonstrating an estimated
 gestational age by ultrasound of 33w 1d. This is correlated
 with expected estimated gestational age by prior ultrasound
 of 34w 1d. EFW is currently at the 41%. This is downward
 trending from the prior exam, but this is likely largely due to a
 short femur length included in the EFW analysis rather than
 an indicator of IUGR.

 No late developing fetal anatomic abnormalities are noted
 associated with the lateral ventricles, stomach, kidneys or
 bladder. The four chamber heart could not be well assessed
 due to fetal positioning combined with advanced gestational
 age.

 Subjectively and quantitatively low normal amniotic fluid
 volume.

## 2014-01-23 ENCOUNTER — Encounter (HOSPITAL_COMMUNITY): Payer: Self-pay | Admitting: Obstetrics & Gynecology

## 2020-12-31 ENCOUNTER — Other Ambulatory Visit: Payer: Self-pay

## 2020-12-31 ENCOUNTER — Inpatient Hospital Stay (HOSPITAL_COMMUNITY)
Admission: EM | Admit: 2020-12-31 | Discharge: 2021-01-03 | DRG: 637 | Disposition: A | Discharge status: Home / Self Care | Payer: BC Managed Care – PPO | Attending: Family Medicine | Admitting: Family Medicine

## 2020-12-31 ENCOUNTER — Encounter (HOSPITAL_COMMUNITY): Payer: Self-pay | Admitting: Emergency Medicine

## 2020-12-31 DIAGNOSIS — H538 Other visual disturbances: Secondary | ICD-10-CM | POA: Diagnosis present

## 2020-12-31 DIAGNOSIS — R1084 Generalized abdominal pain: Secondary | ICD-10-CM | POA: Diagnosis not present

## 2020-12-31 DIAGNOSIS — E1110 Type 2 diabetes mellitus with ketoacidosis without coma: Secondary | ICD-10-CM | POA: Diagnosis present

## 2020-12-31 DIAGNOSIS — F17210 Nicotine dependence, cigarettes, uncomplicated: Secondary | ICD-10-CM | POA: Diagnosis present

## 2020-12-31 DIAGNOSIS — E876 Hypokalemia: Secondary | ICD-10-CM | POA: Diagnosis present

## 2020-12-31 DIAGNOSIS — E1010 Type 1 diabetes mellitus with ketoacidosis without coma: Principal | ICD-10-CM | POA: Diagnosis present

## 2020-12-31 DIAGNOSIS — U071 COVID-19: Secondary | ICD-10-CM | POA: Diagnosis present

## 2020-12-31 DIAGNOSIS — R350 Frequency of micturition: Secondary | ICD-10-CM | POA: Diagnosis present

## 2020-12-31 DIAGNOSIS — Z6822 Body mass index (BMI) 22.0-22.9, adult: Secondary | ICD-10-CM

## 2020-12-31 DIAGNOSIS — R630 Anorexia: Secondary | ICD-10-CM | POA: Diagnosis present

## 2020-12-31 LAB — CBC WITH DIFFERENTIAL/PLATELET
Abs Immature Granulocytes: 0.1 10*3/uL — ABNORMAL HIGH (ref 0.00–0.07)
Basophils Absolute: 0.1 10*3/uL (ref 0.0–0.1)
Basophils Relative: 1 %
Eosinophils Absolute: 0 10*3/uL (ref 0.0–0.5)
Eosinophils Relative: 0 %
HCT: 47.6 % — ABNORMAL HIGH (ref 36.0–46.0)
Hemoglobin: 16.9 g/dL — ABNORMAL HIGH (ref 12.0–15.0)
Immature Granulocytes: 1 %
Lymphocytes Relative: 17 %
Lymphs Abs: 2.3 10*3/uL (ref 0.7–4.0)
MCH: 32.6 pg (ref 26.0–34.0)
MCHC: 35.5 g/dL (ref 30.0–36.0)
MCV: 91.9 fL (ref 80.0–100.0)
Monocytes Absolute: 0.8 10*3/uL (ref 0.1–1.0)
Monocytes Relative: 6 %
Neutro Abs: 10.1 10*3/uL — ABNORMAL HIGH (ref 1.7–7.7)
Neutrophils Relative %: 75 %
Platelets: 366 10*3/uL (ref 150–400)
RBC: 5.18 MIL/uL — ABNORMAL HIGH (ref 3.87–5.11)
RDW: 13.7 % (ref 11.5–15.5)
WBC: 13.3 10*3/uL — ABNORMAL HIGH (ref 4.0–10.5)
nRBC: 0 % (ref 0.0–0.2)

## 2020-12-31 MED ORDER — ONDANSETRON 4 MG PO TBDP
4.0000 mg | ORAL_TABLET | Freq: Once | ORAL | Status: AC
  Administered 2020-12-31: 4 mg via ORAL
  Filled 2020-12-31: qty 1

## 2020-12-31 MED ORDER — OXYCODONE-ACETAMINOPHEN 5-325 MG PO TABS
1.0000 | ORAL_TABLET | Freq: Once | ORAL | Status: AC
  Administered 2020-12-31: 1 via ORAL
  Filled 2020-12-31: qty 1

## 2020-12-31 NOTE — ED Provider Notes (Signed)
MSE was initiated and I personally evaluated the patient and placed orders (if any) at  11:12 PM on December 31, 2020.  Patient to ED with 5 days of severe nausea, vomiting since yesterday, 10 pound weight loss, RUQ and right lateral abdominal pain. No known fever. No chest pain/cough. No previous surgeries.  Today's Vitals   12/31/20 2250 12/31/20 2256  BP: 125/90   Pulse: (!) 118   Resp: 16   Temp: 98.5 F (36.9 C)   TempSrc: Oral   SpO2: 100%   Weight:  60 kg  Height:  5\' 5"  (1.651 m)  PainSc:  8    Body mass index is 22.01 kg/m.  Uncomfortable, ill appearing Dry No abdominal distention  The patient appears stable so that the remainder of the MSE may be completed by another provider.   , PA-C 12/31/20 2314    03/02/21, MD 01/01/21 1524

## 2020-12-31 NOTE — ED Triage Notes (Signed)
Patient reports pain across her abdomen / heartburn with occasional emesis and poor appetite for several days , no diarrhea or fever .

## 2021-01-01 ENCOUNTER — Encounter (HOSPITAL_COMMUNITY): Payer: Self-pay | Admitting: Internal Medicine

## 2021-01-01 ENCOUNTER — Emergency Department (HOSPITAL_COMMUNITY): Payer: BC Managed Care – PPO

## 2021-01-01 DIAGNOSIS — R630 Anorexia: Secondary | ICD-10-CM | POA: Diagnosis present

## 2021-01-01 DIAGNOSIS — E1110 Type 2 diabetes mellitus with ketoacidosis without coma: Secondary | ICD-10-CM

## 2021-01-01 DIAGNOSIS — Z6822 Body mass index (BMI) 22.0-22.9, adult: Secondary | ICD-10-CM | POA: Diagnosis not present

## 2021-01-01 DIAGNOSIS — R350 Frequency of micturition: Secondary | ICD-10-CM | POA: Diagnosis present

## 2021-01-01 DIAGNOSIS — F17210 Nicotine dependence, cigarettes, uncomplicated: Secondary | ICD-10-CM | POA: Diagnosis present

## 2021-01-01 DIAGNOSIS — R1084 Generalized abdominal pain: Secondary | ICD-10-CM | POA: Diagnosis present

## 2021-01-01 DIAGNOSIS — E1010 Type 1 diabetes mellitus with ketoacidosis without coma: Secondary | ICD-10-CM | POA: Diagnosis present

## 2021-01-01 DIAGNOSIS — H538 Other visual disturbances: Secondary | ICD-10-CM | POA: Diagnosis present

## 2021-01-01 DIAGNOSIS — U071 COVID-19: Secondary | ICD-10-CM | POA: Diagnosis present

## 2021-01-01 DIAGNOSIS — E876 Hypokalemia: Secondary | ICD-10-CM | POA: Diagnosis present

## 2021-01-01 LAB — URINALYSIS, ROUTINE W REFLEX MICROSCOPIC
Bacteria, UA: NONE SEEN
Bilirubin Urine: NEGATIVE
Glucose, UA: >=500 mg/dL — AB
Ketones, ur: 80 mg/dL — AB
Leukocytes,Ua: NEGATIVE
Nitrite: NEGATIVE
Protein, ur: 100 mg/dL — AB
Specific Gravity, Urine: 1.029 (ref 1.005–1.030)
pH: 6 (ref 5.0–8.0)

## 2021-01-01 LAB — COMPREHENSIVE METABOLIC PANEL
ALT: 14 U/L (ref 0–44)
AST: 15 U/L (ref 15–41)
Albumin: 4.4 g/dL (ref 3.5–5.0)
Alkaline Phosphatase: 61 U/L (ref 38–126)
Anion gap: 20 — ABNORMAL HIGH (ref 5–15)
BUN: 12 mg/dL (ref 6–20)
CO2: 9 mmol/L — ABNORMAL LOW (ref 22–32)
Calcium: 9.1 mg/dL (ref 8.9–10.3)
Chloride: 105 mmol/L (ref 98–111)
Creatinine, Ser: 0.79 mg/dL (ref 0.44–1.00)
GFR, Estimated: >60 mL/min (ref >60)
Glucose, Bld: 376 mg/dL — ABNORMAL HIGH (ref 70–99)
Potassium: 3.4 mmol/L — ABNORMAL LOW (ref 3.5–5.1)
Sodium: 134 mmol/L — ABNORMAL LOW (ref 135–145)
Total Bilirubin: 1.6 mg/dL — ABNORMAL HIGH (ref 0.3–1.2)
Total Protein: 7.5 g/dL (ref 6.5–8.1)

## 2021-01-01 LAB — BASIC METABOLIC PANEL
Anion gap: 9 (ref 5–15)
BUN: 9 mg/dL (ref 6–20)
CO2: 14 mmol/L — ABNORMAL LOW (ref 22–32)
Calcium: 7.4 mg/dL — ABNORMAL LOW (ref 8.9–10.3)
Chloride: 114 mmol/L — ABNORMAL HIGH (ref 98–111)
Creatinine, Ser: 0.55 mg/dL (ref 0.44–1.00)
GFR, Estimated: >60 mL/min (ref >60)
Glucose, Bld: 191 mg/dL — ABNORMAL HIGH (ref 70–99)
Potassium: 2.8 mmol/L — ABNORMAL LOW (ref 3.5–5.1)
Sodium: 137 mmol/L (ref 135–145)

## 2021-01-01 LAB — PHOSPHORUS
Phosphorus: <1 mg/dL — CL (ref 2.5–4.6)
Phosphorus: 1.3 mg/dL — ABNORMAL LOW (ref 2.5–4.6)

## 2021-01-01 LAB — HEMOGLOBIN A1C
Hgb A1c MFr Bld: 10.5 % — ABNORMAL HIGH (ref 4.8–5.6)
Mean Plasma Glucose: 254.65 mg/dL

## 2021-01-01 LAB — HIV ANTIBODY (ROUTINE TESTING W REFLEX): HIV Screen 4th Generation wRfx: NONREACTIVE

## 2021-01-01 LAB — LIPASE, BLOOD: Lipase: 24 U/L (ref 11–51)

## 2021-01-01 LAB — CBG MONITORING, ED
Glucose-Capillary: 263 mg/dL — ABNORMAL HIGH (ref 70–99)
Glucose-Capillary: 210 mg/dL — ABNORMAL HIGH (ref 70–99)
Glucose-Capillary: 186 mg/dL — ABNORMAL HIGH (ref 70–99)
Glucose-Capillary: 158 mg/dL — ABNORMAL HIGH (ref 70–99)
Glucose-Capillary: 235 mg/dL — ABNORMAL HIGH (ref 70–99)
Glucose-Capillary: 178 mg/dL — ABNORMAL HIGH (ref 70–99)

## 2021-01-01 LAB — MAGNESIUM
Magnesium: 1.7 mg/dL (ref 1.7–2.4)
Magnesium: 2.1 mg/dL (ref 1.7–2.4)

## 2021-01-01 LAB — RESP PANEL BY RT-PCR (FLU A&B, COVID) ARPGX2
Influenza A by PCR: NEGATIVE
Influenza B by PCR: NEGATIVE
SARS Coronavirus 2 by RT PCR: POSITIVE — AB

## 2021-01-01 LAB — PREGNANCY, URINE: Preg Test, Ur: NEGATIVE

## 2021-01-01 LAB — BETA-HYDROXYBUTYRIC ACID: Beta-Hydroxybutyric Acid: 7.52 mmol/L — ABNORMAL HIGH (ref 0.05–0.27)

## 2021-01-01 MED ORDER — MAGNESIUM SULFATE 2 GM/50ML IV SOLN
2.0000 g | Freq: Once | INTRAVENOUS | Status: AC
  Administered 2021-01-01: 2 g via INTRAVENOUS
  Filled 2021-01-01: qty 50

## 2021-01-01 MED ORDER — INSULIN STARTER KIT- PEN NEEDLES (ENGLISH)
1.0000 | Freq: Once | Status: DC
  Filled 2021-01-01: qty 1

## 2021-01-01 MED ORDER — POTASSIUM PHOSPHATES 15 MMOLE/5ML IV SOLN
30.0000 mmol | Freq: Once | INTRAVENOUS | Status: AC
  Administered 2021-01-01: 30 mmol via INTRAVENOUS
  Filled 2021-01-01: qty 10

## 2021-01-01 MED ORDER — ONDANSETRON HCL 4 MG/2ML IJ SOLN
4.0000 mg | Freq: Once | INTRAMUSCULAR | Status: AC
  Administered 2021-01-01: 4 mg via INTRAVENOUS
  Filled 2021-01-01: qty 2

## 2021-01-01 MED ORDER — INSULIN REGULAR(HUMAN) IN NACL 100-0.9 UT/100ML-% IV SOLN
INTRAVENOUS | Status: DC
  Administered 2021-01-01: 7 [IU]/h via INTRAVENOUS
  Filled 2021-01-01: qty 100

## 2021-01-01 MED ORDER — SODIUM CHLORIDE 0.9 % IV BOLUS
1000.0000 mL | Freq: Once | INTRAVENOUS | Status: AC
  Administered 2021-01-01: 1000 mL via INTRAVENOUS

## 2021-01-01 MED ORDER — LIVING WELL WITH DIABETES BOOK
Freq: Once | Status: DC
  Filled 2021-01-01: qty 1

## 2021-01-01 MED ORDER — LACTATED RINGERS IV SOLN: INTRAVENOUS | Status: DC

## 2021-01-01 MED ORDER — ACETAMINOPHEN 650 MG RE SUPP: 650.0000 mg | Freq: Four times a day (QID) | RECTAL | Status: DC | PRN

## 2021-01-01 MED ORDER — IOHEXOL 350 MG/ML SOLN
80.0000 mL | Freq: Once | INTRAVENOUS | Status: AC | PRN
  Administered 2021-01-01: 80 mL via INTRAVENOUS

## 2021-01-01 MED ORDER — INSULIN DETEMIR 100 UNIT/ML ~~LOC~~ SOLN
15.0000 [IU] | Freq: Every day | SUBCUTANEOUS | Status: DC
  Administered 2021-01-01 – 2021-01-03 (×3): 15 [IU] via SUBCUTANEOUS
  Filled 2021-01-01 (×3): qty 0.15

## 2021-01-01 MED ORDER — INSULIN ASPART 100 UNIT/ML IJ SOLN
0.0000 [IU] | Freq: Three times a day (TID) | INTRAMUSCULAR | Status: DC
  Administered 2021-01-01: 2 [IU] via SUBCUTANEOUS
  Administered 2021-01-01 – 2021-01-02 (×2): 3 [IU] via SUBCUTANEOUS
  Administered 2021-01-03 (×2): 5 [IU] via SUBCUTANEOUS

## 2021-01-01 MED ORDER — ACETAMINOPHEN 325 MG PO TABS
650.0000 mg | ORAL_TABLET | Freq: Four times a day (QID) | ORAL | Status: DC | PRN
  Administered 2021-01-02: 650 mg via ORAL
  Filled 2021-01-01: qty 2

## 2021-01-01 MED ORDER — ONDANSETRON HCL 4 MG PO TABS: 4.0000 mg | ORAL_TABLET | Freq: Four times a day (QID) | ORAL | Status: DC | PRN

## 2021-01-01 MED ORDER — LACTATED RINGERS IV BOLUS
20.0000 mL/kg | Freq: Once | INTRAVENOUS | Status: AC
  Administered 2021-01-01: 1200 mL via INTRAVENOUS

## 2021-01-01 MED ORDER — POTASSIUM CHLORIDE 2 MEQ/ML IV SOLN
INTRAVENOUS | Status: DC
  Filled 2021-01-01 (×10): qty 1000

## 2021-01-01 MED ORDER — ONDANSETRON HCL 4 MG/2ML IJ SOLN: 4.0000 mg | Freq: Four times a day (QID) | INTRAMUSCULAR | Status: DC | PRN

## 2021-01-01 MED ORDER — MORPHINE SULFATE (PF) 4 MG/ML IV SOLN
4.0000 mg | Freq: Once | INTRAVENOUS | Status: AC
  Administered 2021-01-01: 4 mg via INTRAVENOUS
  Filled 2021-01-01: qty 1

## 2021-01-01 MED ORDER — INSULIN ASPART 100 UNIT/ML IJ SOLN
0.0000 [IU] | Freq: Every day | INTRAMUSCULAR | Status: DC
  Administered 2021-01-01 – 2021-01-02 (×2): 2 [IU] via SUBCUTANEOUS

## 2021-01-01 MED ORDER — ENOXAPARIN SODIUM 40 MG/0.4ML IJ SOSY
40.0000 mg | PREFILLED_SYRINGE | INTRAMUSCULAR | Status: DC
  Administered 2021-01-01 – 2021-01-02 (×2): 40 mg via SUBCUTANEOUS
  Filled 2021-01-01 (×2): qty 0.4

## 2021-01-01 MED ORDER — DEXTROSE IN LACTATED RINGERS 5 % IV SOLN: INTRAVENOUS | Status: DC

## 2021-01-01 MED ORDER — POTASSIUM CHLORIDE 10 MEQ/100ML IV SOLN
10.0000 meq | INTRAVENOUS | Status: AC
Start: 2021-01-01 — End: 2021-01-01
  Administered 2021-01-01 (×2): 10 meq via INTRAVENOUS
  Filled 2021-01-01 (×2): qty 100

## 2021-01-01 MED ORDER — POTASSIUM CHLORIDE CRYS ER 20 MEQ PO TBCR
40.0000 meq | EXTENDED_RELEASE_TABLET | Freq: Two times a day (BID) | ORAL | Status: AC
  Administered 2021-01-01 – 2021-01-02 (×4): 40 meq via ORAL
  Filled 2021-01-01 (×4): qty 2

## 2021-01-01 MED ORDER — DEXTROSE 50 % IV SOLN: 0.0000 mL | INTRAVENOUS | Status: DC | PRN

## 2021-01-01 NOTE — ED Notes (Signed)
Pt provided water and crackers for PO trial per MD

## 2021-01-01 NOTE — Progress Notes (Addendum)
Inpatient Diabetes Program Recommendations  AACE/ADA: New Consensus Statement on Inpatient Glycemic Control (2015)  Target Ranges:  Prepandial:   less than 140 mg/dL      Peak postprandial:   less than 180 mg/dL (1-2 hours)      Critically ill patients:  140 - 180 mg/dL   Lab Results  Component Value Date   GLUCAP 158 (H) 01/01/2021   HGBA1C 10.5 (H) 01/01/2021    Review of Glycemic Control Results for Yvette Hull, Yvette Hull (MRN [id number]) as of 01/01/2021 10:27  Ref. Range 01/01/2021 06:37 01/01/2021 08:14 01/01/2021 10:06  Glucose-Capillary Latest Ref Range: 70 - 99 mg/dL 210 (H) 186 (H) 158 (H)   Diabetes history: New onset DM Outpatient Diabetes medications: none Current orders for Inpatient glycemic control: Levemir 15 units QD, Novolog 0-9 units TID, Novolog 0-5 units QHS  Inpatient Diabetes Program Recommendations:    Noted transition plan.  Ordered LWWDM, dietitian, outpatient education and insulin starter kit.  Will plan to see.   Addendum: Spoke with patient regarding new onset diabetes. Patient complained of recent fatigue and polydipsia that she assumed was related to Covid.  Reviewed patient's current A1c of 10.5%. Explained what a A1c is and what it measures. Also reviewed goal A1c with patient, importance of good glucose control @ home, and blood sugar goals. Reviewed patho of DM, DKA, role of pancreas, signs and symptoms of hyperglycemia, impact of covid, vascular changes and commorbidities. Patient will need a meter at discharge. Glucose meter (#[id number]). Reviewed frequency of checking CBGs, target goals, and when to call MD. Encouraged to begin reviewing booklet, checking CBGS and administering insulin. Will to follow up with PCP.  Admits to drinking sugary beverages. Reviewed alternatives and importance of being mindful of CHO intake. Discussed briefly nutritional labels.  Will follow up again on 10/12.   Thanks, Bronson Curb, MSN, RNC-OB Diabetes  Coordinator [phone] (8a-5p)

## 2021-01-01 NOTE — Progress Notes (Signed)
New Admission Note:    Arrival Method: Bed Mental Orientation:  A&O X4  Telemetry: Box 56M Skin Assessment: refer to flowsheets IV:   20 g right forearm; 20g left hand Pain:   no reports of pain Tubes: none Safety Measures: Safety Fall Prevention Plan has been given, discussed and signed Admission: Completed [facility] Orientation: Patient has been orientated to the room, unit and staff.  Family: Lives with husband and children   Orders have been reviewed and implemented. Will continue to monitor the patient. Call light has been placed within reach and bed alarm has been activated.

## 2021-01-01 NOTE — ED Provider Notes (Signed)
[facility] EMERGENCY DEPARTMENT Provider Note   CSN: [id number] Arrival date & time: 12/31/20  1951     History Chief Complaint  Patient presents with   Abdominal Pain    Gastritis/GERD    Yvette Hull is a 40 y.o. female.  HPI     This is a 40 year old female with no reported past medical history who presents with abdominal pain, nausea, vomiting.  Patient reports at least 5 days of worsening abdominal pain.  She states that it is intermittent upper abdomen and radiates to the right side in her flank.  Denies urinary symptoms.  Nothing seems to make it better or worse.  She rates her pain at 7 out of 10.  She is not taking anything for the pain.  She reports significant weight loss.  No fevers, chest pain, shortness of breath.  Denies dysuria or hematuria.  Does report increased urination and some blurry vision.  History reviewed. No pertinent past medical history.  Patient Active Problem List   Diagnosis Date Noted   DKA (diabetic ketoacidosis) (HCC) 01/01/2021    Past Surgical History:  Procedure Laterality Date   NO PAST SURGERIES     TUBAL LIGATION  11/02/2010   Procedure: POST PARTUM TUBAL LIGATION;  Surgeon: Tereso Newcomer, MD;  Location: [facility];  Service: Gynecology;  Laterality: Bilateral;  Bilateral tubal ligation with filshie clips     OB History     Gravida  3   Para  3   Term  3   Preterm      AB      Living  3      SAB      IAB      Ectopic      Multiple      Live Births  1           No family history on file.  Social History   Tobacco Use   Smoking status: Every Day    Packs/day: 1.00    Types: Cigarettes  Substance Use Topics   Alcohol use: No   Drug use: No    Home Medications Prior to Admission medications   Medication Sig Start Date End Date Taking? Authorizing Provider  prenatal vitamin w/FE, FA (PRENATAL 1 + 1) 27-1 MG TABS Take 1 tablet by mouth daily.      [provider]     Allergies    Patient has no known allergies.  Review of Systems   Review of Systems  Constitutional:  Negative for fever.  Eyes:  Positive for visual disturbance.  Respiratory:  Negative for shortness of breath.   Cardiovascular:  Negative for chest pain.  Gastrointestinal:  Positive for abdominal pain, nausea and vomiting. Negative for constipation and diarrhea.  Endocrine: Positive for polyuria.  Genitourinary:  Positive for frequency. Negative for dysuria.  Neurological:  Negative for headaches.  All other systems reviewed and are negative.  Physical Exam Updated Vital Signs BP (!) 142/91   Pulse (!) 104   Temp 98.5 F (36.9 C) (Oral)   Resp 16   Ht 1.651 m (5\' 5" )   Wt 60 kg   SpO2 100%   BMI 22.01 kg/m   Physical Exam Vitals and nursing note reviewed.  Constitutional:      Appearance: She is well-developed. She is ill-appearing. She is not toxic-appearing.  HENT:     Head: Normocephalic and atraumatic.     Mouth/Throat:     Comments: dry  Eyes:     Pupils: Pupils are equal, round, and reactive to light.  Cardiovascular:     Rate and Rhythm: Regular rhythm. Tachycardia present.     Heart sounds: Normal heart sounds.  Pulmonary:     Effort: Pulmonary effort is normal. No respiratory distress.     Breath sounds: No wheezing.  Abdominal:     General: Bowel sounds are normal.     Palpations: Abdomen is soft.     Tenderness: There is generalized abdominal tenderness. There is no guarding or rebound.  Musculoskeletal:     Cervical back: Neck supple.  Skin:    General: Skin is warm and dry.  Neurological:     Mental Status: She is alert and oriented to person, place, and time.  Psychiatric:        Mood and Affect: Mood normal.    ED Results / Procedures / Treatments   Labs (all labs ordered are listed, but only abnormal results are displayed) Labs Reviewed  CBC WITH DIFFERENTIAL/PLATELET - Abnormal; Notable for the following components:      Result  Value   WBC 13.3 (*)    RBC 5.18 (*)    Hemoglobin 16.9 (*)    HCT 47.6 (*)    Neutro Abs 10.1 (*)    Abs Immature Granulocytes 0.10 (*)    All other components within normal limits  COMPREHENSIVE METABOLIC PANEL - Abnormal; Notable for the following components:   Sodium 134 (*)    Potassium 3.4 (*)    CO2 9 (*)    Glucose, Bld 376 (*)    Total Bilirubin 1.6 (*)    Anion gap 20 (*)    All other components within normal limits  URINALYSIS, ROUTINE W REFLEX MICROSCOPIC - Abnormal; Notable for the following components:   Glucose, UA >=500 (*)    Hgb urine dipstick SMALL (*)    Ketones, ur 80 (*)    Protein, ur 100 (*)    All other components within normal limits  BETA-HYDROXYBUTYRIC ACID - Abnormal; Notable for the following components:   Beta-Hydroxybutyric Acid 7.52 (*)    All other components within normal limits  HEMOGLOBIN A1C - Abnormal; Notable for the following components:   Hgb A1c MFr Bld 10.5 (*)    All other components within normal limits  CBG MONITORING, ED - Abnormal; Notable for the following components:   Glucose-Capillary 263 (*)    All other components within normal limits  CBG MONITORING, ED - Abnormal; Notable for the following components:   Glucose-Capillary 210 (*)    All other components within normal limits  LIPASE, BLOOD  PREGNANCY, URINE  BASIC METABOLIC PANEL  BASIC METABOLIC PANEL  BASIC METABOLIC PANEL  BASIC METABOLIC PANEL  BASIC METABOLIC PANEL  I-STAT VENOUS BLOOD GAS, ED    EKG EKG Interpretation  Date/Time:  Tuesday January 01 2021 04:48:16 EDT Ventricular Rate:  101 PR Interval:  112 QRS Duration: 84 QT Interval:  327 QTC Calculation: 424 R Axis:   89 Text Interpretation: Sinus tachycardia Biatrial enlargement Probable LVH with secondary repol abnrm ST depr, consider ischemia, inferior leads No priors available Confirmed by Ross Marcus ([id number]) on 01/01/2021 4:55:55 AM  Radiology CT ABDOMEN PELVIS W CONTRAST  Result  Date: 01/01/2021 CLINICAL DATA:  Acute, nonlocalized abdominal pain EXAM: CT ABDOMEN AND PELVIS WITH CONTRAST TECHNIQUE: Multidetector CT imaging of the abdomen and pelvis was performed using the standard protocol following bolus administration of intravenous contrast. CONTRAST:  57mL OMNIPAQUE IOHEXOL 350  MG/ML SOLN COMPARISON:  None. FINDINGS: Lower chest:  No contributory findings. Hepatobiliary: No focal liver abnormality.Vague dependent gallbladder density without calcified stone or evidence of acute cholecystitis. Pancreas: Unremarkable. Spleen: Unremarkable. Adrenals/Urinary Tract: Negative adrenals. No hydronephrosis or stone. Small renal cystic densities. Tiny accentuated vessels in the left retroperitoneum, of doubtful clinical significance. Unremarkable bladder. Stomach/Bowel:  No obstruction. No visible bowel inflammation. Vascular/Lymphatic: No acute vascular abnormality. No mass or adenopathy. Reproductive:No pathologic findings. Tubal ligation clips. The right-sided clip has become mobile into the left lower quadrant. Other: No ascites or pneumoperitoneum. Musculoskeletal: No acute abnormalities. L5 chronic left pars defect with accentuated disc degeneration and right foraminal impingement from endplate spurring and disc bulging. Remote L1 superior endplate fracture. IMPRESSION: 1. No specific cause for symptoms. 2. Possible gallbladder sludge. 3. 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 Remote L1 compression fracture. Electronically Signed   By: Tiburcio Pea M.D.   On: 01/01/2021 06:27    Procedures Procedures   Medications Ordered in ED Medications  insulin regular, human (MYXREDLIN) 100 units/ 100 mL infusion (7 Units/hr Intravenous New Bag/Given 01/01/21 0540)  lactated ringers infusion ( Intravenous New Bag/Given 01/01/21 0532)  dextrose 5 % in lactated ringers infusion (0 mLs Intravenous Hold 01/01/21 0517)  dextrose 50 % solution 0-50 mL (has no administration in  time range)  potassium chloride 10 mEq in 100 mL IVPB (10 mEq Intravenous New Bag/Given 01/01/21 0641)  ondansetron (ZOFRAN-ODT) disintegrating tablet 4 mg (4 mg Oral Given 12/31/20 2316)  oxyCODONE-acetaminophen (PERCOCET/ROXICET) 5-325 MG per tablet 1 tablet (1 tablet Oral Given 12/31/20 2316)  sodium chloride 0.9 % bolus 1,000 mL (0 mLs Intravenous Stopped 01/01/21 0527)  morphine 4 MG/ML injection 4 mg (4 mg Intravenous Given 01/01/21 0446)  ondansetron (ZOFRAN) injection 4 mg (4 mg Intravenous Given 01/01/21 0446)  lactated ringers bolus 1,200 mL (1,200 mLs Intravenous New Bag/Given 01/01/21 0537)  sodium chloride 0.9 % bolus 1,000 mL (1,000 mLs Intravenous New Bag/Given 01/01/21 0528)  iohexol (OMNIPAQUE) 350 MG/ML injection 80 mL (80 mLs Intravenous Contrast Given 01/01/21 0620)    ED Course  I have reviewed the triage vital signs and the nursing notes.  Pertinent labs & imaging results that were available during my care of the patient were reviewed by me and considered in my medical decision making (see chart for details).    MDM Rules/Calculators/A&P                           Patient presents with abdominal pain.  Also reports recent anorexia, weight loss, nausea, vomiting.  No known history of diabetes but notably hyperglycemic on her initial lab work from triage.  Vital signs notable for mild tachycardia.  Denies chest pain or shortness of breath.  Does not believe herself to be pregnant.   She has really generalized tenderness on exam in the mid and right abdomen.  Considerations include but not limited to cholecystitis, appendicitis, gastritis.  Her lab work indicates likely DKA with an anion gap of 20 and a bicarb of 9.  Beta hydroxybutyrate and hemoglobin A1c were added.  She was started on Endo tool.  She was also given 2 L of fluid.  CT scan of the abdomen obtained given abdominal pain.  CT reviewed by myself.  No obvious intra-abdominal process.  She does have some gallbladder  sludge.  Highly suspect her abdominal symptoms, anorexia, and nausea are related to new onset diabetes and DKA.  We  will plan for admission to the hospital for fluid resuscitation and diabetes management Final Clinical Impression(s) / ED Diagnoses Final diagnoses:  Diabetic ketoacidosis without coma associated with type 1 diabetes mellitus (HCC)  Generalized abdominal pain    Rx / DC Orders ED Discharge Orders     None        Johny Pitstick, Mayer Masker, MD 01/01/21 [id number]

## 2021-01-01 NOTE — H&P (Signed)
History and Physical    Yvette Hull FTD:[id number] DOB: February 10, 1981 DOA: 12/31/2020  PCP: Pcp, No  Patient coming from: Home  I have personally briefly reviewed patient's old medical records available.   Chief Complaint: Abdominal pain nausea and vomiting  HPI: Yvette Hull is a 40 y.o. female with no significant medical issues presenting to the ER with about 1 week of abdominal pain, nausea, unable to eat or drink.  Retrospectively patient thinks it is about more than 2 weeks that she has poor appetite, she has not been able to eat anything because of nausea.  Symptoms worse for last 1 week, severe since yesterday so came to the ER.  Generalized abdominal pain.  7/10 in intensity.  Associated with nausea and frequent vomiting.  No radiation of the pain.  Trying to eat makes it worse.  Did not try anything to make it better.  Bowel habits normal.  Recently going to bathroom multiple times to urinate but without any dysuria. Denies any history of fever chills.  Denies any headache.  She does have some blurry vision that has improved now.  Denies any chest pain or shortness of breath.  Denies any weakness of the extremities. ED Course: Hemodynamically stable.  Anion gap 20.  Potassium 3.4.  Blood sugars 376.  CT scan abdomen pelvis essentially normal.  Treated with aggressive IV fluids and is started on IV insulin with improvement of symptoms.  Review of Systems: all systems are reviewed and pertinent positive as per HPI otherwise rest are negative.    History reviewed. No pertinent past medical history.  Past Surgical History:  Procedure Laterality Date   NO PAST SURGERIES     TUBAL LIGATION  11/02/2010   Procedure: POST PARTUM TUBAL LIGATION;  Surgeon: Tereso Newcomer, MD;  Location: [facility];  Service: Gynecology;  Laterality: Bilateral;  Bilateral tubal ligation with filshie clips    Social history   reports that she has been smoking cigarettes. She has been smoking an average  of 1 pack per day. She does not have any smokeless tobacco history on file. She reports that she does not drink alcohol and does not use drugs.  No Known Allergies  History reviewed. No pertinent family history.   Prior to Admission medications   Medication Sig Start Date End Date Taking? Authorizing Provider  prenatal vitamin w/FE, FA (PRENATAL 1 + 1) 27-1 MG TABS Take 1 tablet by mouth daily.      [provider]    Physical Exam: Vitals:   01/01/21 0545 01/01/21 0600 01/01/21 0615 01/01/21 0630  BP: 130/81 (!) 142/91 129/76 129/79  Pulse: 82 (!) 104 100 85  Resp: 15 16 20 15   Temp:      TempSrc:      SpO2: 100% 100% 100% 100%  Weight:      Height:        Constitutional: NAD, calm, comfortable Vitals:   01/01/21 0545 01/01/21 0600 01/01/21 0615 01/01/21 0630  BP: 130/81 (!) 142/91 129/76 129/79  Pulse: 82 (!) 104 100 85  Resp: 15 16 20 15   Temp:      TempSrc:      SpO2: 100% 100% 100% 100%  Weight:      Height:       Eyes: PERRL, lids and conjunctivae normal ENMT: Mucous membranes are moist. Posterior pharynx clear of any exudate or lesions.Normal dentition.  Neck: normal, supple, no masses, no thyromegaly Respiratory: clear to auscultation bilaterally, no wheezing, no  crackles. Normal respiratory effort. No accessory muscle use.  Cardiovascular: Regular rate and rhythm, no murmurs / rubs / gallops. No extremity edema. 2+ pedal pulses. No carotid bruits.  Abdomen: no tenderness, no masses palpated. No hepatosplenomegaly. Bowel sounds positive.  Musculoskeletal: no clubbing / cyanosis. No joint deformity upper and lower extremities. Good ROM, no contractures. Normal muscle tone.  Skin: no rashes, lesions, ulcers. No induration Neurologic: CN 2-12 grossly intact. Sensation intact, DTR normal. Strength 5/5 in all 4.  Psychiatric: Normal judgment and insight. Alert and oriented x 3. Normal mood.     Labs on Admission: I have personally reviewed following labs  and imaging studies  CBC: Recent Labs  Lab 12/31/20 2339  WBC 13.3*  NEUTROABS 10.1*  HGB 16.9*  HCT 47.6*  MCV 91.9  PLT 366   Basic Metabolic Panel: Recent Labs  Lab 12/31/20 2339  NA 134*  K 3.4*  CL 105  CO2 9*  GLUCOSE 376*  BUN 12  CREATININE 0.79  CALCIUM 9.1   GFR: Estimated Creatinine Clearance: 85.8 mL/min (by C-G formula based on SCr of 0.79 mg/dL). Liver Function Tests: Recent Labs  Lab 12/31/20 2339  AST 15  ALT 14  ALKPHOS 61  BILITOT 1.6*  PROT 7.5  ALBUMIN 4.4   Recent Labs  Lab 12/31/20 2339  LIPASE 24   No results for input(s): AMMONIA in the last 168 hours. Coagulation Profile: No results for input(s): INR, PROTIME in the last 168 hours. Cardiac Enzymes: No results for input(s): CKTOTAL, CKMB, CKMBINDEX, TROPONINI in the last 168 hours. BNP (last 3 results) No results for input(s): PROBNP in the last 8760 hours. HbA1C: Recent Labs    01/01/21 0430  HGBA1C 10.5*   CBG: Recent Labs  Lab 01/01/21 0521 01/01/21 0637  GLUCAP 263* 210*   Lipid Profile: No results for input(s): CHOL, HDL, LDLCALC, TRIG, CHOLHDL, LDLDIRECT in the last 72 hours. Thyroid Function Tests: No results for input(s): TSH, T4TOTAL, FREET4, T3FREE, THYROIDAB in the last 72 hours. Anemia Panel: No results for input(s): VITAMINB12, FOLATE, FERRITIN, TIBC, IRON, RETICCTPCT in the last 72 hours. Urine analysis:    Component Value Date/Time   COLORURINE YELLOW 12/31/2020 2312   APPEARANCEUR CLEAR 12/31/2020 2312   LABSPEC 1.029 12/31/2020 2312   PHURINE 6.0 12/31/2020 2312   GLUCOSEU >=500 (A) 12/31/2020 2312   HGBUR SMALL (A) 12/31/2020 2312   BILIRUBINUR NEGATIVE 12/31/2020 2312   KETONESUR 80 (A) 12/31/2020 2312   PROTEINUR 100 (A) 12/31/2020 2312   NITRITE NEGATIVE 12/31/2020 2312   LEUKOCYTESUR NEGATIVE 12/31/2020 2312    Radiological Exams on Admission: CT ABDOMEN PELVIS W CONTRAST  Result Date: 01/01/2021 CLINICAL DATA:  Acute, nonlocalized  abdominal pain EXAM: CT ABDOMEN AND PELVIS WITH CONTRAST TECHNIQUE: Multidetector CT imaging of the abdomen and pelvis was performed using the standard protocol following bolus administration of intravenous contrast. CONTRAST:  75mL OMNIPAQUE IOHEXOL 350 MG/ML SOLN COMPARISON:  None. FINDINGS: Lower chest:  No contributory findings. Hepatobiliary: No focal liver abnormality.Vague dependent gallbladder density without calcified stone or evidence of acute cholecystitis. Pancreas: Unremarkable. Spleen: Unremarkable. Adrenals/Urinary Tract: Negative adrenals. No hydronephrosis or stone. Small renal cystic densities. Tiny accentuated vessels in the left retroperitoneum, of doubtful clinical significance. Unremarkable bladder. Stomach/Bowel:  No obstruction. No visible bowel inflammation. Vascular/Lymphatic: No acute vascular abnormality. No mass or adenopathy. Reproductive:No pathologic findings. Tubal ligation clips. The right-sided clip has become mobile into the left lower quadrant. Other: No ascites or pneumoperitoneum. Musculoskeletal: No acute abnormalities. L5 chronic  left pars defect with accentuated disc degeneration and right foraminal impingement from endplate spurring and disc bulging. Remote L1 superior endplate fracture. IMPRESSION: 1. No specific cause for symptoms. 2. Possible gallbladder sludge. 3. 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 Remote L1 compression fracture. Electronically Signed   By: Tiburcio Pea M.D.   On: 01/01/2021 06:27    EKG: Independently reviewed.,  Sinus rhythm.  Nonspecific ST changes.  Assessment/Plan Principal Problem:   DKA (diabetic ketoacidosis) (HCC)     1.  Diabetic ketoacidosis.  Newly diagnosed type 2 diabetes, uncontrolled with ketoacidosis. Admit to monitored unit given severity of symptoms. Vital signs every 4 hours. N.p.o. until anion gap is closed. Blood sugars every hour until on IV insulin. BMP every 4 hours until on IV insulin.   Keep potassium 3.5-5, supplement as per protocol. Check magnesium and phosphorus every 12 hours. Bolus IV fluids Given in the ER. Keep on isotonic saline until blood sugars more than 250. When blood sugars less than 250, changed to 5% dextrose and continue insulin regimen. Will transition to subcu insulin once patient is able to take by mouth as well anion gap is closed. Hemoglobin A1c 10.5. Discussed with patient about needing insulin on discharge as initial treatment plan and patient is agreeable to learn insulin administration and take it at home.  2.  Smoker: Discussed smoking cessation.  Declines nicotine patch.  3.  Nausea vomiting and abdominal pain: Due to #1.  Improving.    DVT prophylaxis: Lovenox subcu  Code Status: Full code Family Communication: Husband at the bedside Disposition Plan: Home Consults called: None Admission status: Observation.  Progressive bed when patient is on insulin infusion.   Dorcas Carrow MD [facility] Pager [phone]

## 2021-01-02 LAB — BASIC METABOLIC PANEL
Anion gap: 6 (ref 5–15)
BUN: 6 mg/dL (ref 6–20)
CO2: 23 mmol/L (ref 22–32)
Calcium: 8 mg/dL — ABNORMAL LOW (ref 8.9–10.3)
Chloride: 108 mmol/L (ref 98–111)
Creatinine, Ser: 0.45 mg/dL (ref 0.44–1.00)
GFR, Estimated: >60 mL/min (ref >60)
Glucose, Bld: 77 mg/dL (ref 70–99)
Potassium: 3.3 mmol/L — ABNORMAL LOW (ref 3.5–5.1)
Sodium: 137 mmol/L (ref 135–145)

## 2021-01-02 LAB — PHOSPHORUS
Phosphorus: 1.2 mg/dL — ABNORMAL LOW (ref 2.5–4.6)
Phosphorus: 1.6 mg/dL — ABNORMAL LOW (ref 2.5–4.6)

## 2021-01-02 LAB — MAGNESIUM
Magnesium: 1.9 mg/dL (ref 1.7–2.4)
Magnesium: 1.8 mg/dL (ref 1.7–2.4)

## 2021-01-02 LAB — GLUCOSE, CAPILLARY
Glucose-Capillary: 142 mg/dL — ABNORMAL HIGH (ref 70–99)
Glucose-Capillary: 73 mg/dL (ref 70–99)
Glucose-Capillary: 206 mg/dL — ABNORMAL HIGH (ref 70–99)
Glucose-Capillary: 86 mg/dL (ref 70–99)
Glucose-Capillary: 201 mg/dL — ABNORMAL HIGH (ref 70–99)

## 2021-01-02 MED ORDER — POTASSIUM PHOSPHATES 15 MMOLE/5ML IV SOLN
30.0000 mmol | Freq: Once | INTRAVENOUS | Status: AC
  Administered 2021-01-02: 30 mmol via INTRAVENOUS
  Filled 2021-01-02: qty 10

## 2021-01-02 MED ORDER — GLUCERNA SHAKE PO LIQD
237.0000 mL | Freq: Three times a day (TID) | ORAL | Status: DC
  Administered 2021-01-02 – 2021-01-03 (×3): 237 mL via ORAL

## 2021-01-02 NOTE — Plan of Care (Signed)

## 2021-01-02 NOTE — Progress Notes (Signed)
Education was done on self-injection of Insulin by demonstration using the insulin training kit. Pt has been cooperative and willing to learn. All questions has been answered.

## 2021-01-02 NOTE — Discharge Instructions (Signed)
Outpatient Diabetes Education  You have been referred to [facility] for outpatient diabetes education. A referral has been sent and the office will contact you for an appointment. For additional questions or to schedule an appointment, call [phone].    Carbohydrate Counting For People With Diabetes  Foods with carbohydrates make your blood glucose level go up. Learning how to count carbohydrates can help you control your blood glucose levels. First, identify the foods you eat that contain carbohydrates. Then, using the Foods with Carbohydrates chart, determine about how much carbohydrates are in your meals and snacks. Make sure you are eating foods with fiber, protein, and healthy fat along with your carbohydrate foods. Foods with Carbohydrates The following table shows carbohydrate foods that have about 15 grams of carbohydrate each. Using measuring cups, spoons, or a food scale when you first begin learning about carbohydrate counting can help you learn about the portion sizes you typically eat. The following foods have 15 grams carbohydrate each:  Grains . 1 slice bread (1 ounce)  . 1 small tortilla (6-inch size)  .  large bagel (1 ounce)  . 1/3 cup pasta or rice (cooked)  .  hamburger or hot dog bun ( ounce)  .  cup cooked cereal  .  to  cup ready-to-eat cereal  . 2 taco shells (5-inch size) Fruit . 1 small fresh fruit ( to 1 cup)  .  medium banana  . 17 small grapes (3 ounces)  . 1 cup melon or berries  .  cup canned or frozen fruit  . 2 tablespoons dried fruit (blueberries, cherries, cranberries, raisins)  .  cup unsweetened fruit juice  Starchy Vegetables .  cup cooked beans, peas, corn, potatoes/sweet potatoes  .  large baked potato (3 ounces)  . 1 cup acorn or butternut squash  Snack Foods . 3 to 6 crackers  . 8 potato chips or 13 tortilla chips ( ounce to 1 ounce)  . 3 cups popped popcorn  Dairy . 3/4 cup (6  ounces) nonfat plain yogurt, or yogurt with sugar-free sweetener  . 1 cup milk  . 1 cup plain rice, soy, coconut or flavored almond milk Sweets and Desserts .  cup ice cream or frozen yogurt  . 1 tablespoon jam, jelly, pancake syrup, table sugar, or honey  . 2 tablespoons light pancake syrup  . 1 inch square of frosted cake or 2 inch square of unfrosted cake  . 2 small cookies (2/3 ounce each) or  large cookie  Sometimes you'll have to estimate carbohydrate amounts if you don't know the exact recipe. One cup of mixed foods like soups can have 1 to 2 carbohydrate servings, while some casseroles might have 2 or more servings of carbohydrate. Foods that have less than 20 calories in each serving can be counted as "free" foods. Count 1 cup raw vegetables, or  cup cooked non-starchy vegetables as "free" foods. If you eat 3 or more servings at one meal, then count them as 1 carbohydrate serving.  Foods without Carbohydrates  Not all foods contain carbohydrates. Meat, some dairy, fats, non-starchy vegetables, and many beverages don't contain carbohydrate. So when you count carbohydrates, you can generally exclude chicken, pork, beef, fish, seafood, eggs, tofu, cheese, butter, sour cream, avocado, nuts, seeds, olives, mayonnaise, water, black coffee, unsweetened tea, and zero-calorie drinks. Vegetables with no or low carbohydrate include green beans, cauliflower, tomatoes, and onions. How much carbohydrate should I eat at each meal?  Carbohydrate   counting can help you plan your meals and manage your weight. Following are some starting points for carbohydrate intake at each meal. Work with your registered dietitian nutritionist to find the best range that works for your blood glucose and weight.   To Lose Weight To Maintain Weight  Women 2 - 3 carb servings 3 - 4 carb servings  Men 3 - 4 carb servings 4 - 5 carb servings  Checking your blood glucose after meals will help you know if you need to adjust the  timing, type, or number of carbohydrate servings in your meal plan. Achieve and keep a healthy body weight by balancing your food intake and physical activity.  Tips How should I plan my meals?  Plan for half the food on your plate to include non-starchy vegetables, like salad greens, broccoli, or carrots. Try to eat 3 to 5 servings of non-starchy vegetables every day. Have a protein food at each meal. Protein foods include chicken, fish, meat, eggs, or beans (note that beans contain carbohydrate). These two food groups (non-starchy vegetables and proteins) are low in carbohydrate. If you fill up your plate with these foods, you will eat less carbohydrate but still fill up your stomach. Try to limit your carbohydrate portion to  of the plate.  What fats are healthiest to eat?  Diabetes increases risk for heart disease. To help protect your heart, eat more healthy fats, such as olive oil, nuts, and avocado. Eat less saturated fats like butter, cream, and high-fat meats, like bacon and sausage. Avoid trans fats, which are in all foods that list "partially hydrogenated oil" as an ingredient. What should I drink?  Choose drinks that are not sweetened with sugar. The healthiest choices are water, carbonated or seltzer waters, and tea and coffee without added sugars.  Sweet drinks will make your blood glucose go up very quickly. One serving of soda or energy drink is  cup. It is best to drink these beverages only if your blood glucose is low.  Artificially sweetened, or diet drinks, typically do not increase your blood glucose if they have zero calories in them. Read labels of beverages, as some diet drinks do have carbohydrate and will raise your blood glucose. Label Reading Tips Read Nutrition Facts labels to find out how many grams of carbohydrate are in a food you want to eat. Don't forget: sometimes serving sizes on the label aren't the same as how much food you are going to eat, so you may need to  calculate how much carbohydrate is in the food you are serving yourself.   Carbohydrate Counting for People with Diabetes Sample 1-Day Menu  Breakfast  cup yogurt, low fat, low sugar (1 carbohydrate serving)   cup cereal, ready-to-eat, unsweetened (1 carbohydrate serving)  1 cup strawberries (1 carbohydrate serving)   cup almonds ( carbohydrate serving)  Lunch 1, 5 ounce can chunk light tuna  2 ounces cheese, low fat cheddar  6 whole wheat crackers (1 carbohydrate serving)  1 small apple (1 carbohydrate servings)   cup carrots ( carbohydrate serving)   cup snap peas  1 cup 1% milk (1 carbohydrate serving)   Evening Meal Stir fry made with: 3 ounces chicken  1 cup brown rice (3 carbohydrate servings)   cup broccoli ( carbohydrate serving)   cup green beans   cup onions  1 tablespoon olive oil  2 tablespoons teriyaki sauce ( carbohydrate serving)  Evening Snack 1 extra small banana (1 carbohydrate serving)  1   tablespoon peanut butter   Carbohydrate Counting for People with Diabetes Vegan Sample 1-Day Menu  Breakfast 1 cup cooked oatmeal (2 carbohydrate servings)   cup blueberries (1 carbohydrate serving)  2 tablespoons flaxseeds  1 cup soymilk fortified with calcium and vitamin D  1 cup coffee  Lunch 2 slices whole wheat bread (2 carbohydrate servings)   cup baked tofu   cup lettuce  2 slices tomato  2 slices avocado   cup baby carrots ( carbohydrate serving)  1 orange (1 carbohydrate serving)  1 cup soymilk fortified with calcium and vitamin D   Evening Meal Burrito made with: 1 6-inch corn tortilla (1 carbohydrate serving)  1 cup refried vegetarian beans (2 carbohydrate servings)   cup chopped tomatoes   cup lettuce   cup salsa  1/3 cup brown rice (1 carbohydrate serving)  1 tablespoon olive oil for rice   cup zucchini   Evening Snack 6 small whole grain crackers (1 carbohydrate serving)  2 apricots ( carbohydrate serving)   cup unsalted peanuts  ( carbohydrate serving)    Carbohydrate Counting for People with Diabetes Vegetarian (Lacto-Ovo) Sample 1-Day Menu  Breakfast 1 cup cooked oatmeal (2 carbohydrate servings)   cup blueberries (1 carbohydrate serving)  2 tablespoons flaxseeds  1 egg  1 cup 1% milk (1 carbohydrate serving)  1 cup coffee  Lunch 2 slices whole wheat bread (2 carbohydrate servings)  2 ounces low-fat cheese   cup lettuce  2 slices tomato  2 slices avocado   cup baby carrots ( carbohydrate serving)  1 orange (1 carbohydrate serving)  1 cup unsweetened tea  Evening Meal Burrito made with: 1 6-inch corn tortilla (1 carbohydrate serving)   cup refried vegetarian beans (1 carbohydrate serving)   cup tomatoes   cup lettuce   cup salsa  1/3 cup brown rice (1 carbohydrate serving)  1 tablespoon olive oil for rice   cup zucchini  1 cup 1% milk (1 carbohydrate serving)  Evening Snack 6 small whole grain crackers (1 carbohydrate serving)  2 apricots ( carbohydrate serving)   cup unsalted peanuts ( carbohydrate serving)    Copyright 2020  Academy of [employer] and Dietetics. All rights reserved.  Using Nutrition Labels: Carbohydrate  . Serving Size  . Look at the serving size. All the information on the label is based on this portion. . Servings Per Container  . The number of servings contained in the package. . Guidelines for Carbohydrate  . Look at the total grams of carbohydrate in the serving size.  . 1 carbohydrate choice = 15 grams of carbohydrate. Range of Carbohydrate Grams Per Choice  Carbohydrate Grams/Choice Carbohydrate Choices  6-10   11-20 1  21-25 1  26-35 2  36-40 2  41-50 3  51-55 3  56-65 4  66-70 4  71-80 5    Copyright 2020  Academy of [employer] and Dietetics. All rights reserved.   

## 2021-01-02 NOTE — Progress Notes (Signed)
PROGRESS NOTE    Yvette Hull  KWI:[id number] DOB: [birth year] DOA: 12/31/2020 PCP: Pcp, No    Brief Narrative:  This 40 years old female with no significant past medical history presented to the ED with complaints of 1 week history of abdominal pain, nausea and unable to eat or drink.  Patient also reports increased urinary frequency. She denies any nausea,  vomiting, fever or chills. She is found to have high anion gap metabolic acidosis secondary to DKA.  Patient was a started on IV insulin, IV fluids and potassium.  CT abdomen and pelvis essentially unremarkable.   Patient was admitted for DKA.  Patient successfully transitioned to subcu insulin after anion gap closed.  Started on carb modified diet.  Assessment & Plan:    Principal Problem:   DKA (diabetic ketoacidosis) ([place])   High anion gap metabolic acidosis secondary to DKA: Patient presented with nausea, vomiting, abdominal pain. She is found to have new onset diabetes with DKA. Patient is started on IV insulin, IV hydration and potassium. Anion gap closed.  She is successfully transitioned to subcu insulin and sliding scale. Patient is started on carb modified diet, still reports having nausea and vomiting. Continue IV Zofran as needed for nausea and vomiting. Diabetic coordinator consulted.  Hemoglobin A1c 10.5. Patient will be discharged home on insulin, needs education.  Nausea and vomiting: She still reports having significant nausea, denies vomiting. Continue Zofran as needed.  Hypokalemia/hypophosphatemia: Replacement in progress, recheck a.m. labs.  Smoking: Tobacco counseling completed,   COVID+: She does not have any respiratory symptoms. Continue airborne and droplet isolation.    DVT prophylaxis: Lovenox Code Status: Full code Family Communication: No family at bedside Disposition Plan:   Status is: Inpatient  Remains inpatient appropriate because:Inpatient level of care appropriate due  to severity of illness  Dispo: The patient is from: Home              Anticipated d/c is to: Home              Patient currently is not medically stable to d/c.   Difficult to place patient No   Consultants:  None  Procedures: None Antimicrobials: None  Subjective: Patient was seen and examined at bedside.  Overnight events noted.  She still reports having nausea but denies any vomiting.She is started on carb modified diet.  States it causes soreness while eating.  Objective: Vitals:   01/01/21 1934 01/02/21 0000 01/02/21 0636 01/02/21 0729  BP: 119/72 108/72 101/66 (!) 127/91  Pulse: 85 86 68 84  Resp: $Remo'18 18 18 16  'YWqgH$ Temp: 98.3 F (36.8 C) 98.1 F (36.7 C) 98.4 F (36.9 C) 98.9 F (37.2 C)  TempSrc: Oral Oral  Oral  SpO2: 100% 100% 100% 100%  Weight: 55.6 kg     Height: $Remove'5\' 5"'EFwCCJZ$  (1.651 m)       Intake/Output Summary (Last 24 hours) at 01/02/2021 1200 Last data filed at 01/02/2021 0600 Gross per 24 hour  Intake 2669.33 ml  Output 1 ml  Net 2668.33 ml   Filed Weights   12/31/20 2256 01/01/21 1934  Weight: 60 kg 55.6 kg    Examination:  General exam: Appears comfortable, not in any acute distress. Respiratory system: Clear to auscultation bilaterally, respiratory effort normal. Cardiovascular system: S1-S2 heard, regular rate and rhythm, no murmur.. Gastrointestinal system: Abdomen is soft, nontender, nondistended, BS +. Central nervous system: Alert and oriented x 3. No focal neurological deficits. Extremities: No edema, no cyanosis, no clubbing. Skin:  No rashes, lesions or ulcers Psychiatry: Judgement and insight appear normal. Mood & affect appropriate.     Data Reviewed: I have personally reviewed following labs and imaging studies  CBC: Recent Labs  Lab 12/31/20 2339  WBC 13.3*  NEUTROABS 10.1*  HGB 16.9*  HCT 47.6*  MCV 91.9  PLT 161   Basic Metabolic Panel: Recent Labs  Lab 12/31/20 2339 01/01/21 0800 01/01/21 1700 01/02/21 0431  NA 134*  137  --  137  K 3.4* 2.8*  --  3.3*  CL 105 114*  --  108  CO2 9* 14*  --  23  GLUCOSE 376* 191*  --  77  BUN 12 9  --  6  CREATININE 0.79 0.55  --  0.45  CALCIUM 9.1 7.4*  --  8.0*  MG  --  1.7 2.1 1.9  PHOS  --  <1.0* 1.3* 1.2*   GFR: Estimated Creatinine Clearance: 83.7 mL/min (by C-G formula based on SCr of 0.45 mg/dL). Liver Function Tests: Recent Labs  Lab 12/31/20 2339  AST 15  ALT 14  ALKPHOS 61  BILITOT 1.6*  PROT 7.5  ALBUMIN 4.4   Recent Labs  Lab 12/31/20 2339  LIPASE 24   No results for input(s): AMMONIA in the last 168 hours. Coagulation Profile: No results for input(s): INR, PROTIME in the last 168 hours. Cardiac Enzymes: No results for input(s): CKTOTAL, CKMB, CKMBINDEX, TROPONINI in the last 168 hours. BNP (last 3 results) No results for input(s): PROBNP in the last 8760 hours. HbA1C: Recent Labs    01/01/21 0430  HGBA1C 10.5*   CBG: Recent Labs  Lab 01/01/21 1139 01/01/21 1604 01/01/21 1940 01/02/21 0633 01/02/21 1129  GLUCAP 235* 178* 142* 73 206*   Lipid Profile: No results for input(s): CHOL, HDL, LDLCALC, TRIG, CHOLHDL, LDLDIRECT in the last 72 hours. Thyroid Function Tests: No results for input(s): TSH, T4TOTAL, FREET4, T3FREE, THYROIDAB in the last 72 hours. Anemia Panel: No results for input(s): VITAMINB12, FOLATE, FERRITIN, TIBC, IRON, RETICCTPCT in the last 72 hours. Sepsis Labs: No results for input(s): PROCALCITON, LATICACIDVEN in the last 168 hours.  Recent Results (from the past 240 hour(s))  Resp Panel by RT-PCR (Flu A&B, Covid) Nasopharyngeal Swab     Status: Abnormal   Collection Time: 01/01/21  7:25 AM   Specimen: Nasopharyngeal Swab; Nasopharyngeal(NP) swabs in vial transport medium  Result Value Ref Range Status   SARS Coronavirus 2 by RT PCR POSITIVE (A) NEGATIVE Final    Comment: RESULT CALLED TO, READ BACK BY AND VERIFIED WITH: RN J DODD [id number] FCP (NOTE) SARS-CoV-2 target nucleic acids are  DETECTED.  The SARS-CoV-2 RNA is generally detectable in upper respiratory specimens during the acute phase of infection. Positive results are indicative of the presence of the identified virus, but do not rule out bacterial infection or co-infection with other pathogens not detected by the test. Clinical correlation with patient history and other diagnostic information is necessary to determine patient infection status. The expected result is Negative.  Fact Sheet for Patients: EntrepreneurPulse.com.au  Fact Sheet for Healthcare Providers: IncredibleEmployment.be  This test is not yet approved or cleared by the [place] FDA and  has been authorized for detection and/or diagnosis of SARS-CoV-2 by FDA under an Emergency Use Authorization (EUA).  This EUA will remain in effect (meaning this test can be used) f or the duration of  the COVID-19 declaration under Section 564(b)(1) of the Act, 21 U.S.C. section 360bbb-3(b)(1), unless the authorization is  terminated or revoked sooner.     Influenza A by PCR NEGATIVE NEGATIVE Final   Influenza B by PCR NEGATIVE NEGATIVE Final    Comment: (NOTE) The Xpert Xpress SARS-CoV-2/FLU/RSV plus assay is intended as an aid in the diagnosis of influenza from Nasopharyngeal swab specimens and should not be used as a sole basis for treatment. Nasal washings and aspirates are unacceptable for Xpert Xpress SARS-CoV-2/FLU/RSV testing.  Fact Sheet for Patients: EntrepreneurPulse.com.au  Fact Sheet for Healthcare Providers: IncredibleEmployment.be  This test is not yet approved or cleared by the [place] FDA and has been authorized for detection and/or diagnosis of SARS-CoV-2 by FDA under an Emergency Use Authorization (EUA). This EUA will remain in effect (meaning this test can be used) for the duration of the COVID-19 declaration under Section 564(b)(1) of the Act, 21  U.S.C. section 360bbb-3(b)(1), unless the authorization is terminated or revoked.  Performed at [facility], [place] [address] [place], [place] [address]     Radiology Studies: CT ABDOMEN PELVIS W CONTRAST  Result Date: 01/01/2021 CLINICAL DATA:  Acute, nonlocalized abdominal pain EXAM: CT ABDOMEN AND PELVIS WITH CONTRAST TECHNIQUE: Multidetector CT imaging of the abdomen and pelvis was performed using the standard protocol following bolus administration of intravenous contrast. CONTRAST:  49mL OMNIPAQUE IOHEXOL 350 MG/ML SOLN COMPARISON:  None. FINDINGS: Lower chest:  No contributory findings. Hepatobiliary: No focal liver abnormality.Vague dependent gallbladder density without calcified stone or evidence of acute cholecystitis. Pancreas: Unremarkable. Spleen: Unremarkable. Adrenals/Urinary Tract: Negative adrenals. No hydronephrosis or stone. Small renal cystic densities. Tiny accentuated vessels in the left retroperitoneum, of doubtful clinical significance. Unremarkable bladder. Stomach/Bowel:  No obstruction. No visible bowel inflammation. Vascular/Lymphatic: No acute vascular abnormality. No mass or adenopathy. Reproductive:No pathologic findings. Tubal ligation clips. The right-sided clip has become mobile into the left lower quadrant. Other: No ascites or pneumoperitoneum. Musculoskeletal: No acute abnormalities. L5 chronic left pars defect with accentuated disc degeneration and right foraminal impingement from endplate spurring and disc bulging. Remote L1 superior endplate fracture. IMPRESSION: 1. No specific cause for symptoms. 2. Possible gallbladder sludge. 3. 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 Remote L1 compression fracture. Electronically Signed   By: Jorje Guild M.D.   On: 01/01/2021 06:27    Scheduled Meds:  enoxaparin (LOVENOX) injection  40 mg Subcutaneous Q24H   insulin aspart  0-5 Units Subcutaneous QHS   insulin aspart  0-9 Units Subcutaneous TID  WC   insulin detemir  15 Units Subcutaneous Daily   insulin starter kit- pen needles  1 kit Other Once   living well with diabetes book   Does not apply Once   potassium chloride  40 mEq Oral BID   Continuous Infusions:  lactated ringers with kcl 100 mL/hr at 01/02/21 0820   potassium PHOSPHATE IVPB (in mmol) Stopped (01/02/21 0653)     LOS: 1 day    Time spent: 35 mins    Quinterius Gaida, MD [facility]   If 7PM-7AM, please contact night-coverage

## 2021-01-02 NOTE — Progress Notes (Signed)
Initial Nutrition Assessment  DOCUMENTATION CODES:   Not applicable  INTERVENTION:  -Glucerna Shake po TID, each supplement provides 220 kcal and 10 grams of protein -Provided DM education in AVS discharge summary and will follow-up tomorrow to attempt education again also placed order for outpatient follow-up to provide continued support after discharge  NUTRITION DIAGNOSIS:   Inadequate oral intake related to nausea as evidenced by per patient/family report.  GOAL:   Patient will meet greater than or equal to 90% of their needs  MONITOR:   PO intake, Supplement acceptance, Weight trends, Labs, I & O's  REASON FOR ASSESSMENT:   Consult Diet education  ASSESSMENT:   Pt with no known PMH admitted with high anion gap metabolic acidosis 2/2 DKA. Pt also COVID+.  Pt w/ new dx DM. RD consulted to provided DM education. Pt unavailable at time of RD visit. Will place education in d/c summary and place outpatient consult to provide support after discharge. Will follow-up with pt tomorrow to attempt education again. Per H&P, pt reports 1 week h/o abdominal pain, nausea, and inability to tolerate PO PTA. Pt reports nausea is ongoing but denies any vomiting. Initiated on carb modified diet yesterday but RN reports ongoing poor PO as she states that eating causes soreness in her stomach.   PO Intake: 50% x 2 recorded meals  Limited weight history available for review. Admission weight 55.6 kg, last weight on file was 75.5 kg from 11/01/10.   Medications: SSI TID w/ meals and at bedtime, levermir 15 units daily, klor-con IVF: LR w/ KCL @ 174ml/hr Labs: K+ 3.3 (L), PO4 1.2 (L) CBGs: 73-206 x24 hours A1c: 10.5%  No UOP documented x24 hours I/O: +593ml since admit  NUTRITION - FOCUSED PHYSICAL EXAM: Unable to perform at this time. Will attempt at follow-up.   Diet Order:   Diet Order             Diet Carb Modified Fluid consistency: Thin; Room service appropriate? Yes  Diet  effective now                   EDUCATION NEEDS:   Education needs have been addressed  Skin:  Skin Assessment: Reviewed RN Assessment  Last BM:  10/11  Height:   Ht Readings from Last 1 Encounters:  01/01/21 5\' 5"  (1.651 m)    Weight:   Wt Readings from Last 3 Encounters:  01/01/21 55.6 kg  11/01/10 75.5 kg    BMI:  Body mass index is 20.4 kg/m.  Estimated Nutritional Needs:   Kcal:  1700-1900  Protein:  85-95 grams  Fluid:  >1.7L/d    01/01/11, MS, RD, LDN (she/her/hers) RD pager number and weekend/on-call pager number located in Amion.

## 2021-01-02 NOTE — Progress Notes (Addendum)
Inpatient Diabetes Program Recommendations  AACE/ADA: New Consensus Statement on Inpatient Glycemic Control (2015)  Target Ranges:  Prepandial:   less than 140 mg/dL      Peak postprandial:   less than 180 mg/dL (1-2 hours)      Critically ill patients:  140 - 180 mg/dL   Lab Results  Component Value Date   GLUCAP 73 01/02/2021   HGBA1C 10.5 (H) 01/01/2021    Review of Glycemic Control  Inpatient Diabetes Program Recommendations:   Spoke with patient and discussed insulin and sent video of proper insulin pen injection demonstration to patient's cell phone. Attached hypoglycemia, sick day management, diabetes and exercise, and carbohydrate counting to exit notes for patient. Nurse Evon Slack reviewing insulin injection with patient and plans to allow patient to give own injections.  Patient will need @ discharge: -Levemir flexpen # [id number] -Insulin pen needles # [id number] -CBG meter and supplies # [id number]  Patient started decreasing sweetened soft drinks prior to admission and plans to continue to limit sugary drinks, foods, and other carbohydrates. 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 Plans to start back to the gym and exercise.  RN Evon Slack taught patient insulin pen using insulin pen teaching station. Also gave patient additional information regarding nutrition with handout.  Thank you, Billy Fischer. Sulayman Manning, RN, MSN, CDE  Diabetes Coordinator Inpatient Glycemic Control Team Team Pager [phone] (8am-5pm) 01/02/2021 9:24 AM

## 2021-01-02 NOTE — TOC Initial Note (Signed)
Transition of Care [facility]) - Initial/Assessment Note    Patient Details  Name: Yvette Hull MRN: [id number] Date of Birth: 06/13/1980  Transition of Care [facility]) CM/SW Contact:    Tom-Johnson, Hershal Coria, RN Phone Number: 01/02/2021, 3:16 PM  Clinical Narrative:                 CM spoke with patient vis phone due to Covid isolation. Presented to the ED with  complaints of 1 wk of abdominal pain, nausea, unable to eat or drink and increased urinary frequency. She is found to have high anion gap metabolic acidosis secondary to DKA. Newly Diabetes diagnosis. Found to be Covid Positive. States she lives with her husband and two children. Dad still living and has three siblings. All supportive with care. Independent with care prior to hospitalization and drives self. Employed [employer]ician, dispensing. Does not have DME's and does not need them at this time. Has [employer] but does not have a PCP. States she has an appointment at [facility] to establish as a patient. No recommendations noted. Medical workup continues. CM will continue to follow with needs.    Expected Discharge Plan: Home/Self Care Barriers to Discharge: Continued Medical Work up   Patient Goals and CMS Choice Patient states their goals for this hospitalization and ongoing recovery are:: To go home CMS Medicare.gov Compare Post Acute Care list provided to:: Patient Choice offered to / list presented to : NA  Expected Discharge Plan and Services Expected Discharge Plan: Home/Self Care   Discharge Planning Services: CM Consult Post Acute Care Choice: NA Living arrangements for the past 2 months: Mobile Home                 DME Arranged: N/A DME Agency: NA       HH Arranged: NA HH Agency: NA        Prior Living Arrangements/Services Living arrangements for the past 2 months: Mobile Home Lives with:: Spouse, Minor Children Patient language and need for interpreter reviewed:: Yes Do you feel safe going  back to the place where you live?: Yes      Need for Family Participation in Patient Care: Yes (Comment) Care giver support system in place?: Yes (comment)   Criminal Activity/Legal Involvement Pertinent to Current Situation/Hospitalization: No - Comment as needed  Activities of Daily Living Home Assistive Devices/Equipment: None ADL Screening (condition at time of admission) Patient's cognitive ability adequate to safely complete daily activities?: Yes Is the patient deaf or have difficulty hearing?: No Does the patient have difficulty seeing, even when wearing glasses/contacts?: No Does the patient have difficulty concentrating, remembering, or making decisions?: No Patient able to express need for assistance with ADLs?: No Does the patient have difficulty dressing or bathing?: No Independently performs ADLs?: Yes (appropriate for developmental age) Does the patient have difficulty walking or climbing stairs?: No Weakness of Legs: None Weakness of Arms/Hands: None  Permission Sought/Granted Permission sought to share information with : Case Manager, Family Supports Permission granted to share information with : Yes, Verbal Permission Granted              Emotional Assessment Appearance:: Appears stated age Attitude/Demeanor/Rapport: Engaged Affect (typically observed): Accepting, Appropriate, Hopeful Orientation: : Oriented to Self, Oriented to Place, Oriented to  Time, Oriented to Situation Alcohol / Substance Use: Not Applicable Psych Involvement: No (comment)  Admission diagnosis:  Generalized abdominal pain [R10.84] DKA (diabetic ketoacidosis) (HCC) [E11.10] Diabetic ketoacidosis without coma associated with type 1 diabetes mellitus (  HCC) [E10.10] Diabetic ketoacidosis without coma associated with type 2 diabetes mellitus (HCC) [E11.10] Patient Active Problem List   Diagnosis Date Noted   DKA (diabetic ketoacidosis) (HCC) 01/01/2021   PCP:  Aviva Kluver Pharmacy:    [id number] - [address], [place] - [address] [place] [address] Phone: [phone] Fax: [phone]     Social Determinants of Health (SDOH) Interventions    Readmission Risk Interventions No flowsheet data found.

## 2021-01-03 LAB — GLUCOSE, CAPILLARY
Glucose-Capillary: 252 mg/dL — ABNORMAL HIGH (ref 70–99)
Glucose-Capillary: 271 mg/dL — ABNORMAL HIGH (ref 70–99)

## 2021-01-03 LAB — BASIC METABOLIC PANEL
Anion gap: 9 (ref 5–15)
BUN: <5 mg/dL — ABNORMAL LOW (ref 6–20)
CO2: 25 mmol/L (ref 22–32)
Calcium: 8.1 mg/dL — ABNORMAL LOW (ref 8.9–10.3)
Chloride: 103 mmol/L (ref 98–111)
Creatinine, Ser: 0.44 mg/dL (ref 0.44–1.00)
GFR, Estimated: >60 mL/min (ref >60)
Glucose, Bld: 227 mg/dL — ABNORMAL HIGH (ref 70–99)
Potassium: 3.5 mmol/L (ref 3.5–5.1)
Sodium: 137 mmol/L (ref 135–145)

## 2021-01-03 LAB — MAGNESIUM: Magnesium: 1.9 mg/dL (ref 1.7–2.4)

## 2021-01-03 LAB — CBC
HCT: 36.1 % (ref 36.0–46.0)
Hemoglobin: 13.1 g/dL (ref 12.0–15.0)
MCH: 32.7 pg (ref 26.0–34.0)
MCHC: 36.3 g/dL — ABNORMAL HIGH (ref 30.0–36.0)
MCV: 90 fL (ref 80.0–100.0)
Platelets: 226 10*3/uL (ref 150–400)
RBC: 4.01 MIL/uL (ref 3.87–5.11)
RDW: 13.7 % (ref 11.5–15.5)
WBC: 6.8 10*3/uL (ref 4.0–10.5)
nRBC: 0 % (ref 0.0–0.2)

## 2021-01-03 LAB — PHOSPHORUS: Phosphorus: 1.8 mg/dL — ABNORMAL LOW (ref 2.5–4.6)

## 2021-01-03 MED ORDER — INSULIN PEN NEEDLE 32G X 4 MM MISC: 2 refills | Status: DC

## 2021-01-03 MED ORDER — INSULIN STARTER KIT- PEN NEEDLES (ENGLISH): 1.0000 | Freq: Once | 0 refills | Status: AC

## 2021-01-03 MED ORDER — BLOOD GLUCOSE METER KIT: PACK | 0 refills | Status: AC

## 2021-01-03 MED ORDER — INSULIN DETEMIR 100 UNIT/ML FLEXPEN: 15.0000 [IU] | PEN_INJECTOR | Freq: Every day | SUBCUTANEOUS | 11 refills | Status: DC

## 2021-01-03 MED ORDER — INSULIN STARTER KIT- PEN NEEDLES (ENGLISH): 1.0000 | Freq: Once | 0 refills | Status: DC

## 2021-01-03 MED ORDER — DIPHENHYDRAMINE HCL 12.5 MG/5ML PO ELIX
12.5000 mg | ORAL_SOLUTION | Freq: Once | ORAL | Status: AC
  Administered 2021-01-03: 12.5 mg via ORAL

## 2021-01-03 MED ORDER — K PHOS MONO-SOD PHOS DI & MONO 155-852-130 MG PO TABS
250.0000 mg | ORAL_TABLET | Freq: Three times a day (TID) | ORAL | Status: DC
Start: 2021-01-03 — End: 2021-01-03
  Administered 2021-01-03: 250 mg via ORAL
  Filled 2021-01-03 (×3): qty 1

## 2021-01-03 MED ORDER — INSULIN DETEMIR 100 UNIT/ML ~~LOC~~ SOLN: 15.0000 [IU] | Freq: Every day | SUBCUTANEOUS | 11 refills | Status: DC

## 2021-01-03 MED ORDER — K PHOS MONO-SOD PHOS DI & MONO 155-852-130 MG PO TABS: 250.0000 mg | ORAL_TABLET | Freq: Three times a day (TID) | ORAL | 0 refills | Status: AC

## 2021-01-03 NOTE — Discharge Summary (Signed)
Physician Discharge Summary  Yvette Hull DHR:[id number] DOB: 1980-03-27 DOA: 12/31/2020  PCP: Pcp, No  Admit date: 12/31/2020  Discharge date: 01/03/2021  Admitted From: Home.  Disposition:  Home.  Recommendations for Outpatient Follow-up:  Follow up with PCP in 1-2 weeks. Please obtain BMP/CBC in one week. Advised to follow-up with endocrinologist as scheduled. Advised to take 15 units of  Levemir flexipen daily.  Home Health: None Equipment/Devices:None  Discharge Condition: Good CODE STATUS:Full code Diet recommendation: Carb Modified   Brief Summary / Hospital course: This 40 years old female with no significant past medical history presented to the ED with complaints of 1 week history of abdominal pain, nausea and unable to eat or drink.  Patient also reports increased urinary frequency. She denies any vomiting, fever or chills. She was found to have high anion gap metabolic acidosis secondary to DKA.  Patient was started on IV insulin, IV fluids and potassium.  CT abdomen and pelvis essentially unremarkable.   Patient was admitted for DKA.  Patient was managed with IV insulin and IV hydration.  Anion gap closed, patient was successfully transitioned to subcu insulin patient was started on carb modified diet.  Blood sugars has improved.  Diabetic education completed.  Patient feels better and want to be discharged.  Patient is being discharged home on Levemir FlexPen 12 units daily, advised to follow-up with endocrinologist.  Appointment has been made.  Patient is being discharged home.  She was managed for below problems.  Discharge Diagnoses:  Principal Problem:   DKA (diabetic ketoacidosis) ([place])  High anion gap metabolic acidosis secondary to DKA: Patient presented with nausea, vomiting, abdominal pain. She is found to have new onset diabetes with DKA. Patient is started on IV insulin, IV hydration and potassium. Anion gap closed.  She is successfully transitioned  to subcu insulin and sliding scale. Patient is started on carb modified diet, still reports having nausea and vomiting. Continue IV Zofran as needed for nausea and vomiting. Diabetic coordinator consulted.  Hemoglobin A1c 10.5. Patient being discharged home on Levemir FlexPen 12 units daily.   Nausea and vomiting: > Resolved. Continue Zofran as needed.   Hypokalemia/hypophosphatemia: Replaced and improved.   Smoking: Tobacco counseling completed,    COVID+: She does not have any respiratory symptoms. Continue airborne and droplet isolation.    Discharge Instructions  Discharge Instructions     Amb Referral to Nutrition and Diabetic Education   Complete by: As directed    Ambulatory referral to Nutrition and Diabetic Education   Complete by: As directed    Call MD for:  difficulty breathing, headache or visual disturbances   Complete by: As directed    Call MD for:  persistant dizziness or light-headedness   Complete by: As directed    Call MD for:  persistant nausea and vomiting   Complete by: As directed    Diet - low sodium heart healthy   Complete by: As directed    Diet - low sodium heart healthy   Complete by: As directed    Diet Carb Modified   Complete by: As directed    Discharge instructions   Complete by: As directed    Advised to follow-up with primary care physician in 1 week. Advised to follow-up with endocrinologist as scheduled. Advised to take 15 units of Levemir Levemir flexipen daily.   Increase activity slowly   Complete by: As directed    Increase activity slowly   Complete by: As directed  Allergies as of 01/03/2021   No Known Allergies      Medication List     TAKE these medications    blood glucose meter kit and supplies Dispense based on patient and insurance preference. Use up to four times daily as directed. (FOR ICD-10 E10.9, E11.9).   ibuprofen 200 MG tablet Commonly known as: ADVIL Take 800 mg by mouth every 6 (six)  hours as needed for headache or mild pain.   insulin detemir 100 UNIT/ML FlexPen Commonly known as: LEVEMIR Inject 15 Units into the skin daily.   Insulin Pen Needle 32G X 4 MM Misc Use as directed.   insulin starter kit- pen needles Misc 1 kit by Other route once for 1 dose.   phosphorus 155-852-130 MG tablet Commonly known as: K PHOS NEUTRAL Take 1 tablet (250 mg total) by mouth 3 (three) times daily for 3 days.        Follow-up Information     Elayne Snare, MD Follow up in 1 week(s).   Specialty: Endocrinology Contact information: [place] [address] [phone]                No Known Allergies  Consultations: None   Procedures/Studies: CT ABDOMEN PELVIS W CONTRAST  Result Date: 01/01/2021 CLINICAL DATA:  Acute, nonlocalized abdominal pain EXAM: CT ABDOMEN AND PELVIS WITH CONTRAST TECHNIQUE: Multidetector CT imaging of the abdomen and pelvis was performed using the standard protocol following bolus administration of intravenous contrast. CONTRAST:  21mL OMNIPAQUE IOHEXOL 350 MG/ML SOLN COMPARISON:  None. FINDINGS: Lower chest:  No contributory findings. Hepatobiliary: No focal liver abnormality.Vague dependent gallbladder density without calcified stone or evidence of acute cholecystitis. Pancreas: Unremarkable. Spleen: Unremarkable. Adrenals/Urinary Tract: Negative adrenals. No hydronephrosis or stone. Small renal cystic densities. Tiny accentuated vessels in the left retroperitoneum, of doubtful clinical significance. Unremarkable bladder. Stomach/Bowel:  No obstruction. No visible bowel inflammation. Vascular/Lymphatic: No acute vascular abnormality. No mass or adenopathy. Reproductive:No pathologic findings. Tubal ligation clips. The right-sided clip has become mobile into the left lower quadrant. Other: No ascites or pneumoperitoneum. Musculoskeletal: No acute abnormalities. L5 chronic left pars defect with accentuated disc  degeneration and right foraminal impingement from endplate spurring and disc bulging. Remote L1 superior endplate fracture. IMPRESSION: 1. No specific cause for symptoms. 2. Possible gallbladder sludge. 3. 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 Remote L1 compression fracture. Electronically Signed   By: Jorje Guild M.D.   On: 01/01/2021 06:27      Subjective: Patient was seen and examined at bedside.  Overnight events noted.   Patient reports feeling much improved.  Patient has been tolerating carb modified diet.   Blood sugar has improved.  She wants to be discharged.  Discharge Exam: Vitals:   01/03/21 0441 01/03/21 0845  BP: 108/76 114/76  Pulse: 84 78  Resp: 18 20  Temp: 98.4 F (36.9 C)   SpO2: 98% 98%   Vitals:   01/02/21 1646 01/02/21 1941 01/03/21 0441 01/03/21 0845  BP: 115/78 107/62 108/76 114/76  Pulse: 82 89 84 78  Resp: $Remo'18 19 18 20  'oFnXT$ Temp: 98.4 F (36.9 C) 98.5 F (36.9 C) 98.4 F (36.9 C)   TempSrc:      SpO2: 100% 98% 98% 98%  Weight:      Height:        General: Pt is alert, awake, not in acute distress Cardiovascular: RRR, S1/S2 +, no rubs, no gallops Respiratory: CTA bilaterally, no wheezing, no  rhonchi Abdominal: Soft, NT, ND, bowel sounds + Extremities: no edema, no cyanosis    The results of significant diagnostics from this hospitalization (including imaging, microbiology, ancillary and laboratory) are listed below for reference.     Microbiology: Recent Results (from the past 240 hour(s))  Resp Panel by RT-PCR (Flu A&B, Covid) Nasopharyngeal Swab     Status: Abnormal   Collection Time: 01/01/21  7:25 AM   Specimen: Nasopharyngeal Swab; Nasopharyngeal(NP) swabs in vial transport medium  Result Value Ref Range Status   SARS Coronavirus 2 by RT PCR POSITIVE (A) NEGATIVE Final    Comment: RESULT CALLED TO, READ BACK BY AND VERIFIED WITH: RN J DODD [id number] FCP (NOTE) SARS-CoV-2 target nucleic acids are DETECTED.  The  SARS-CoV-2 RNA is generally detectable in upper respiratory specimens during the acute phase of infection. Positive results are indicative of the presence of the identified virus, but do not rule out bacterial infection or co-infection with other pathogens not detected by the test. Clinical correlation with patient history and other diagnostic information is necessary to determine patient infection status. The expected result is Negative.  Fact Sheet for Patients: EntrepreneurPulse.com.au  Fact Sheet for Healthcare Providers: IncredibleEmployment.be  This test is not yet approved or cleared by the [place] FDA and  has been authorized for detection and/or diagnosis of SARS-CoV-2 by FDA under an Emergency Use Authorization (EUA).  This EUA will remain in effect (meaning this test can be used) f or the duration of  the COVID-19 declaration under Section 564(b)(1) of the Act, 21 U.S.C. section 360bbb-3(b)(1), unless the authorization is terminated or revoked sooner.     Influenza A by PCR NEGATIVE NEGATIVE Final   Influenza B by PCR NEGATIVE NEGATIVE Final    Comment: (NOTE) The Xpert Xpress SARS-CoV-2/FLU/RSV plus assay is intended as an aid in the diagnosis of influenza from Nasopharyngeal swab specimens and should not be used as a sole basis for treatment. Nasal washings and aspirates are unacceptable for Xpert Xpress SARS-CoV-2/FLU/RSV testing.  Fact Sheet for Patients: EntrepreneurPulse.com.au  Fact Sheet for Healthcare Providers: IncredibleEmployment.be  This test is not yet approved or cleared by the [place] FDA and has been authorized for detection and/or diagnosis of SARS-CoV-2 by FDA under an Emergency Use Authorization (EUA). This EUA will remain in effect (meaning this test can be used) for the duration of the COVID-19 declaration under Section 564(b)(1) of the Act, 21 U.S.C. section  360bbb-3(b)(1), unless the authorization is terminated or revoked.  Performed at [facility], [place] [address], [place], [place] [address]      Labs: BNP (last 3 results) No results for input(s): BNP in the last 8760 hours. Basic Metabolic Panel: Recent Labs  Lab 12/31/20 2339 01/01/21 0800 01/01/21 1700 01/02/21 0431 01/02/21 2316 01/03/21 0433  NA 134* 137  --  137  --  137  K 3.4* 2.8*  --  3.3*  --  3.5  CL 105 114*  --  108  --  103  CO2 9* 14*  --  23  --  25  GLUCOSE 376* 191*  --  77  --  227*  BUN 12 9  --  6  --  <5*  CREATININE 0.79 0.55  --  0.45  --  0.44  CALCIUM 9.1 7.4*  --  8.0*  --  8.1*  MG  --  1.7 2.1 1.9 1.8 1.9  PHOS  --  <1.0* 1.3* 1.2* 1.6* 1.8*   Liver  Function Tests: Recent Labs  Lab 12/31/20 2339  AST 15  ALT 14  ALKPHOS 61  BILITOT 1.6*  PROT 7.5  ALBUMIN 4.4   Recent Labs  Lab 12/31/20 2339  LIPASE 24   No results for input(s): AMMONIA in the last 168 hours. CBC: Recent Labs  Lab 12/31/20 2339 01/03/21 0720  WBC 13.3* 6.8  NEUTROABS 10.1*  --   HGB 16.9* 13.1  HCT 47.6* 36.1  MCV 91.9 90.0  PLT 366 226   Cardiac Enzymes: No results for input(s): CKTOTAL, CKMB, CKMBINDEX, TROPONINI in the last 168 hours. BNP: Invalid input(s): POCBNP CBG: Recent Labs  Lab 01/02/21 1129 01/02/21 1643 01/02/21 2101 01/03/21 0635 01/03/21 1144  GLUCAP 206* 86 201* 252* 271*   D-Dimer No results for input(s): DDIMER in the last 72 hours. Hgb A1c Recent Labs    01/01/21 0430  HGBA1C 10.5*   Lipid Profile No results for input(s): CHOL, HDL, LDLCALC, TRIG, CHOLHDL, LDLDIRECT in the last 72 hours. Thyroid function studies No results for input(s): TSH, T4TOTAL, T3FREE, THYROIDAB in the last 72 hours.  Invalid input(s): FREET3 Anemia work up No results for input(s): VITAMINB12, FOLATE, FERRITIN, TIBC, IRON, RETICCTPCT in the last 72 hours. Urinalysis    Component Value Date/Time   COLORURINE YELLOW 12/31/2020 2312    APPEARANCEUR CLEAR 12/31/2020 2312   LABSPEC 1.029 12/31/2020 2312   PHURINE 6.0 12/31/2020 2312   GLUCOSEU >=500 (A) 12/31/2020 2312   HGBUR SMALL (A) 12/31/2020 2312   BILIRUBINUR NEGATIVE 12/31/2020 2312   KETONESUR 80 (A) 12/31/2020 2312   PROTEINUR 100 (A) 12/31/2020 2312   NITRITE NEGATIVE 12/31/2020 2312   LEUKOCYTESUR NEGATIVE 12/31/2020 2312   Sepsis Labs Invalid input(s): PROCALCITONIN,  WBC,  LACTICIDVEN Microbiology Recent Results (from the past 240 hour(s))  Resp Panel by RT-PCR (Flu A&B, Covid) Nasopharyngeal Swab     Status: Abnormal   Collection Time: 01/01/21  7:25 AM   Specimen: Nasopharyngeal Swab; Nasopharyngeal(NP) swabs in vial transport medium  Result Value Ref Range Status   SARS Coronavirus 2 by RT PCR POSITIVE (A) NEGATIVE Final    Comment: RESULT CALLED TO, READ BACK BY AND VERIFIED WITH: RN J DODD [id number] FCP (NOTE) SARS-CoV-2 target nucleic acids are DETECTED.  The SARS-CoV-2 RNA is generally detectable in upper respiratory specimens during the acute phase of infection. Positive results are indicative of the presence of the identified virus, but do not rule out bacterial infection or co-infection with other pathogens not detected by the test. Clinical correlation with patient history and other diagnostic information is necessary to determine patient infection status. The expected result is Negative.  Fact Sheet for Patients: EntrepreneurPulse.com.au  Fact Sheet for Healthcare Providers: IncredibleEmployment.be  This test is not yet approved or cleared by the [place] FDA and  has been authorized for detection and/or diagnosis of SARS-CoV-2 by FDA under an Emergency Use Authorization (EUA).  This EUA will remain in effect (meaning this test can be used) f or the duration of  the COVID-19 declaration under Section 564(b)(1) of the Act, 21 U.S.C. section 360bbb-3(b)(1), unless the authorization  is terminated or revoked sooner.     Influenza A by PCR NEGATIVE NEGATIVE Final   Influenza B by PCR NEGATIVE NEGATIVE Final    Comment: (NOTE) The Xpert Xpress SARS-CoV-2/FLU/RSV plus assay is intended as an aid in the diagnosis of influenza from Nasopharyngeal swab specimens and should not be used as a sole basis for treatment. Nasal washings and aspirates are unacceptable  for Xpert Xpress SARS-CoV-2/FLU/RSV testing.  Fact Sheet for Patients: EntrepreneurPulse.com.au  Fact Sheet for Healthcare Providers: IncredibleEmployment.be  This test is not yet approved or cleared by the [place] FDA and has been authorized for detection and/or diagnosis of SARS-CoV-2 by FDA under an Emergency Use Authorization (EUA). This EUA will remain in effect (meaning this test can be used) for the duration of the COVID-19 declaration under Section 564(b)(1) of the Act, 21 U.S.C. section 360bbb-3(b)(1), unless the authorization is terminated or revoked.  Performed at [facility], [place] [address], [place], [place] [address]      Time coordinating discharge: Over 30 minutes  SIGNED:   Shawna Clamp, MD  [facility] 01/03/2021, 2:23 PM Pager   If 7PM-7AM, please contact night-coverage

## 2021-01-03 NOTE — TOC Transition Note (Signed)
Transition of Care [facility]) - CM/SW Discharge Note   Patient Details  Name: Yvette Hull MRN: [id number] Date of Birth: [birth year]  Transition of Care [facility]) CM/SW Contact:  Tom-Johnson, Hershal Coria, RN Phone Number: 01/03/2021, 1:12 PM   Clinical Narrative:    CM spoke with patient via phone due to Covid isolation. Scheduled for discharge home today. Diabetes education done and teach back done with nurse for insulin administration. No recommendations from PT/OT. Denies any needs. Husband to pick up at discharge. No further TOC needs noted.   Final next level of care: Home/Self Care Barriers to Discharge: No Barriers Identified   Patient Goals and CMS Choice Patient states their goals for this hospitalization and ongoing recovery are:: To go home CMS Medicare.gov Compare Post Acute Care list provided to:: Patient Choice offered to / list presented to : NA  Discharge Placement                       Discharge Plan and Services   Discharge Planning Services: CM Consult Post Acute Care Choice: NA          DME Arranged: N/A DME Agency: NA       HH Arranged: NA HH Agency: NA        Social Determinants of Health (SDOH) Interventions     Readmission Risk Interventions No flowsheet data found.

## 2021-01-03 NOTE — Plan of Care (Signed)
  RD consulted for nutrition education regarding diabetes.   Lab Results  Component Value Date   HGBA1C 10.5 (H) 01/01/2021    RD provided "Carbohydrate Counting for People with Diabetes" handout from the Academy of Nutrition and Dietetics. Discussed different food groups and their effects on blood sugar, emphasizing carbohydrate-containing foods. Provided list of carbohydrates and recommended serving sizes of common foods.  Discussed importance of controlled and consistent carbohydrate intake throughout the day. Provided examples of ways to balance meals/snacks and encouraged intake of high-fiber, whole grain complex carbohydrates. Teach back method used.  Expect fair compliance.  Body mass index is 20.4 kg/m. Pt meets criteria for normal based on current BMI.  Current diet order is carb modified, patient is consuming approximately 70% of meals at this time (50-100% x 4 recorded meals). Labs and medications reviewed. See full assessment from 10/12. Discharge orders signed by MD. RD contact information provided. If additional nutrition issues arise, please re-consult RD.  Eugene Gavia, MS, RD, LDN (she/her/hers) RD pager number and weekend/on-call pager number located in Amion.

## 2021-01-29 ENCOUNTER — Other Ambulatory Visit: Payer: Self-pay

## 2021-01-29 ENCOUNTER — Ambulatory Visit (INDEPENDENT_AMBULATORY_CARE_PROVIDER_SITE_OTHER): Payer: BC Managed Care – PPO | Admitting: Endocrinology

## 2021-01-29 VITALS — BP 130/80 | HR 91 | Ht 65.0 in | Wt 128.0 lb

## 2021-01-29 DIAGNOSIS — E1010 Type 1 diabetes mellitus with ketoacidosis without coma: Secondary | ICD-10-CM | POA: Diagnosis not present

## 2021-01-29 LAB — POCT GLYCOSYLATED HEMOGLOBIN (HGB A1C): Hemoglobin A1C: 10 % — AB (ref 4.0–5.6)

## 2021-01-29 MED ORDER — INSULIN DETEMIR 100 UNIT/ML FLEXPEN: 5.0000 [IU] | PEN_INJECTOR | Freq: Every day | SUBCUTANEOUS | 11 refills | Status: AC

## 2021-01-29 MED ORDER — NOVOLOG FLEXPEN 100 UNIT/ML ~~LOC~~ SOPN: 3.0000 [IU] | PEN_INJECTOR | Freq: Three times a day (TID) | SUBCUTANEOUS | 11 refills | Status: AC

## 2021-01-29 NOTE — Progress Notes (Signed)
Subjective:    Patient ID: Yvette Hull, female    DOB: 1980/08/06, 39 y.o.   MRN: [id number]  HPI pt is referred by Dr Dwyane Dee, for diabetes.  Pt states DM was dx'ed 3 weeks ago; she is unaware of any chronic complications; she has been on insulin since dx; he has never had GDM, pancreatitis, pancreatic surgery, or severe hypoglycemia. She lost 12 lbs, and has regained most of that.  She has had TL.  She is Public librarian.  She takes Levemir, 15 units QHS.  she brings a record of her cbg's which I have reviewed today.  Cbg varies from 87-504.  It is in general higher as the day goes on.   No past medical history on file.  Past Surgical History:  Procedure Laterality Date   NO PAST SURGERIES     TUBAL LIGATION  11/02/2010   Procedure: POST PARTUM TUBAL LIGATION;  Surgeon: Osborne Oman, MD;  Location: [facility];  Service: Gynecology;  Laterality: Bilateral;  Bilateral tubal ligation with filshie clips    Social History   Socioeconomic History   Marital status: Married    Spouse name: Forensic scientist   Number of children: 3   Years of education: Not on file   Highest education level: Not on file  Occupational History   Not on file  Tobacco Use   Smoking status: Every Day    Packs/day: 1.00    Types: Cigarettes   Smokeless tobacco: Never  Vaping Use   Vaping Use: Never used  Substance and Sexual Activity   Alcohol use: No   Drug use: No   Sexual activity: Yes  Other Topics Concern   Not on file  Social History Narrative   Not on file   Social Determinants of Health   Financial Resource Strain: Not on file  Food Insecurity: Not on file  Transportation Needs: Not on file  Physical Activity: Not on file  Stress: Not on file  Social Connections: Not on file  Intimate Partner Violence: Not on file    Current Outpatient Medications on File Prior to Visit  Medication Sig Dispense Refill   blood glucose meter kit and supplies Dispense based on patient and insurance  preference. Use up to four times daily as directed. (FOR ICD-10 E10.9, E11.9). 1 each 0   ibuprofen (ADVIL) 200 MG tablet Take 800 mg by mouth every 6 (six) hours as needed for headache or mild pain.     No current facility-administered medications on file prior to visit.    No Known Allergies  Family History  Problem Relation Age of Onset   Diabetes Mother    Hypertension Father     BP 130/80 (BP Location: Right Arm, Patient Position: Sitting, Cuff Size: Normal)   Pulse 91   Ht $R'5\' 5"'NI$  (1.651 m)   Wt 128 lb (58.1 kg)   SpO2 99%   BMI 21.30 kg/m    Review of Systems denies sob and n/v.    Objective:   Physical Exam Pulses: dorsalis pedis intact bilat.   MSK: no deformity of the feet.   CV: no leg edema.   Skin:  no ulcer on the feet.  normal color and temp on the feet.  Neuro: sensation is intact to touch on the feet.     Lab Results  Component Value Date   CREATININE 0.44 01/03/2021   BUN <5 (L) 01/03/2021   NA 137 01/03/2021   K 3.5 01/03/2021  CL 103 01/03/2021   CO2 25 01/03/2021   Lab Results  Component Value Date   HGBA1C 10.0 (A) 01/29/2021   I have reviewed outside records, and summarized: Pt was noted to have elevated A1c, and referred here.  She was admitted for DKA.  She was also noted to be a smoker, and was rx'ed nicotine patch.        Assessment & Plan:  Type 1 DM: uncontrolled  Patient Instructions  good diet and exercise significantly improve the control of your diabetes.  please let me know if you wish to be referred to a dietician.  high blood sugar is very risky to your health.  you should see an eye doctor and dentist every year.  It is very important to get all recommended vaccinations.  Controlling your blood pressure and cholesterol drastically reduces the damage diabetes does to your body.  Those who smoke should quit.  Please discuss these with your doctor.  check your blood sugar 4 times a day: before the 3 meals, and at bedtime.   also check if you have symptoms of your blood sugar being too high or too low.  please keep a record of the readings and bring it to your next appointment here (or you can bring the meter itself).  You can write it on any piece of paper.  please call us sooner if your blood sugar goes below 70, or if most of your readings are over 200. We will need to take this complex situation in stages.   For now, please: Reduce the levemir to 5 units at bedtime, and:  Start novolog 3 units 3 times a day (just before each meal).  Please come back for a follow-up appointment in 4 weeks.  Please see a diabetes educator, to consider a pump and/or continuous glucose monitor.

## 2021-01-29 NOTE — Patient Instructions (Addendum)
good diet and exercise significantly improve the control of your diabetes.  please let me know if you wish to be referred to a dietician.  high blood sugar is very risky to your health.  you should see an eye doctor and dentist every year.  It is very important to get all recommended vaccinations.  Controlling your blood pressure and cholesterol drastically reduces the damage diabetes does to your body.  Those who smoke should quit.  Please discuss these with your doctor.  check your blood sugar 4 times a day: before the 3 meals, and at bedtime.  also check if you have symptoms of your blood sugar being too high or too low.  please keep a record of the readings and bring it to your next appointment here (or you can bring the meter itself).  You can write it on any piece of paper.  please call us sooner if your blood sugar goes below 70, or if most of your readings are over 200. We will need to take this complex situation in stages.   For now, please: Reduce the levemir to 5 units at bedtime, and:  Start novolog 3 units 3 times a day (just before each meal).  Please come back for a follow-up appointment in 4 weeks.  Please see a diabetes educator, to consider a pump and/or continuous glucose monitor.

## 2021-02-27 ENCOUNTER — Ambulatory Visit: Payer: BC Managed Care – PPO | Admitting: Endocrinology

## 2021-04-09 ENCOUNTER — Ambulatory Visit: Payer: BC Managed Care – PPO | Admitting: Skilled Nursing Facility1

## 2021-12-15 IMAGING — CT CT ABD-PELV W/ CM
2 of 5 series · 16 of 46 positions shown, 18 images · IV contrast (APPLIED)
Comparison: None.

CLINICAL DATA: Acute, nonlocalized abdominal pain

EXAM:
CT ABDOMEN AND PELVIS WITH CONTRAST
TECHNIQUE: Multidetector CT imaging of the abdomen and pelvis was performed
using the standard protocol following bolus administration of
intravenous contrast.
CONTRAST:  80mL OMNIPAQUE IOHEXOL 350 MG/ML SOLN

[Series 3: abd/ pelvis 5.0 i30f 2 · axial · 0.88mm/px · z∈[+704,+1044]mm · 13 of 76 slices shown, 15 images]
[im 4/76  soft-tissue]
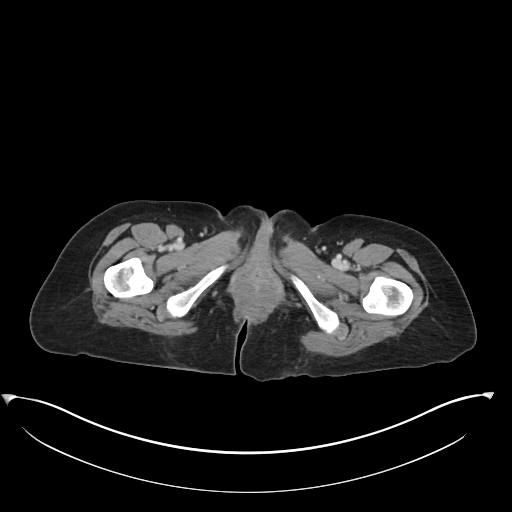
[im 4/76  bone]
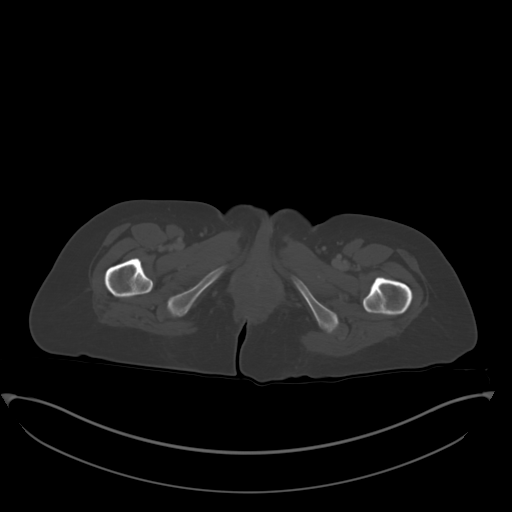
[im 12/76  soft-tissue]
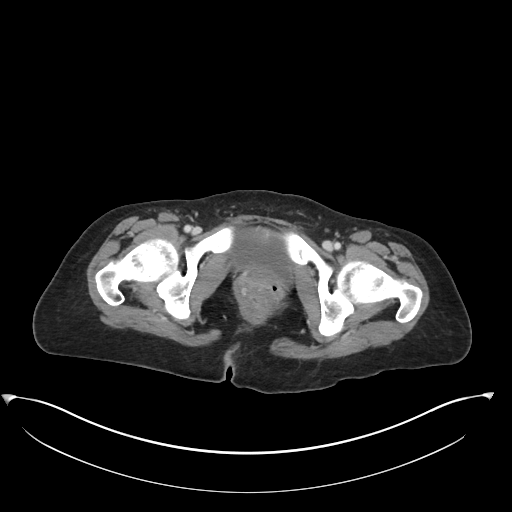
[im 16/76  soft-tissue]
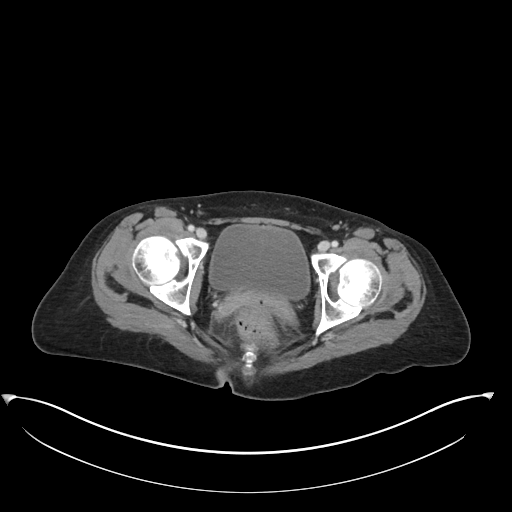
[im 20/76  soft-tissue]
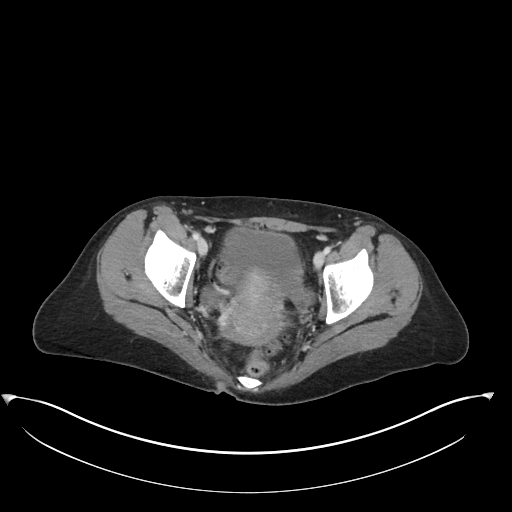
[im 28/76  soft-tissue]
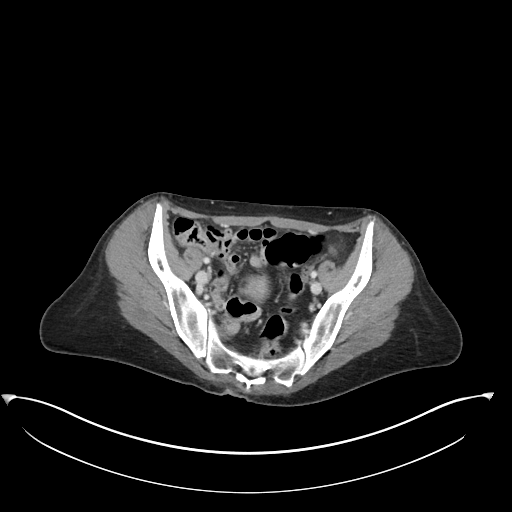
[im 32/76  soft-tissue]
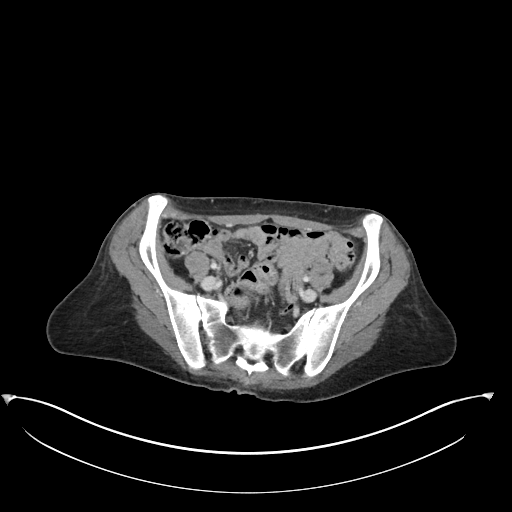
[im 40/76  soft-tissue]
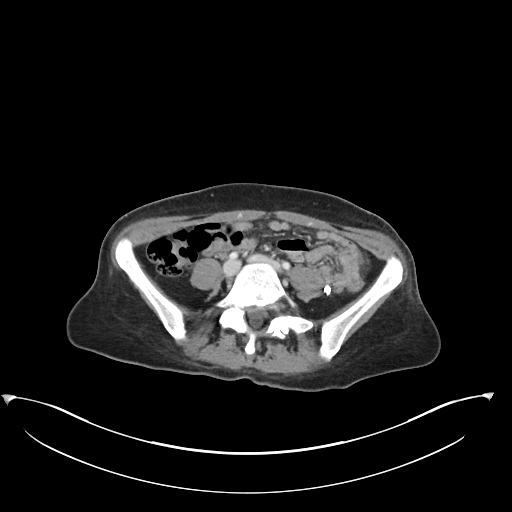
[im 44/76  soft-tissue]
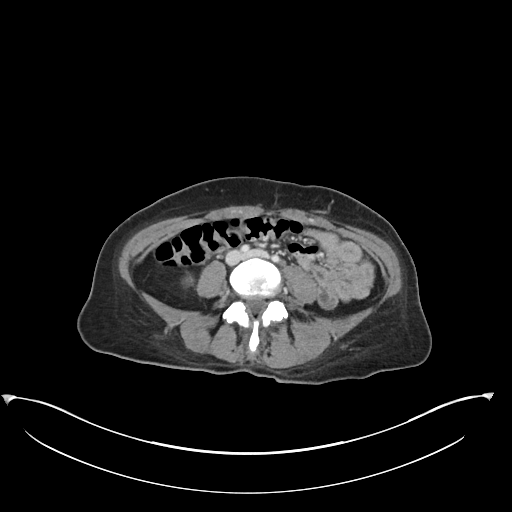
[im 48/76  soft-tissue]
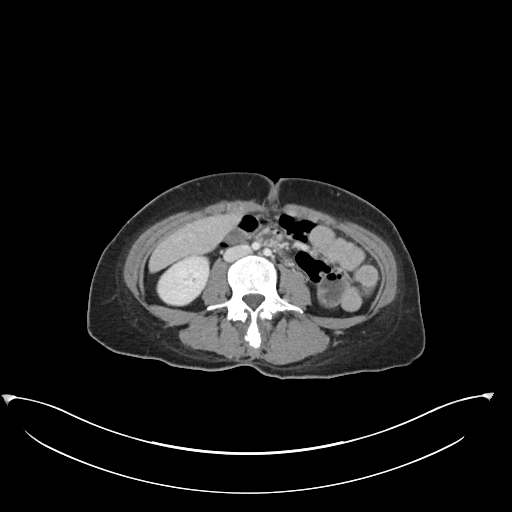
[im 48/76  bone]
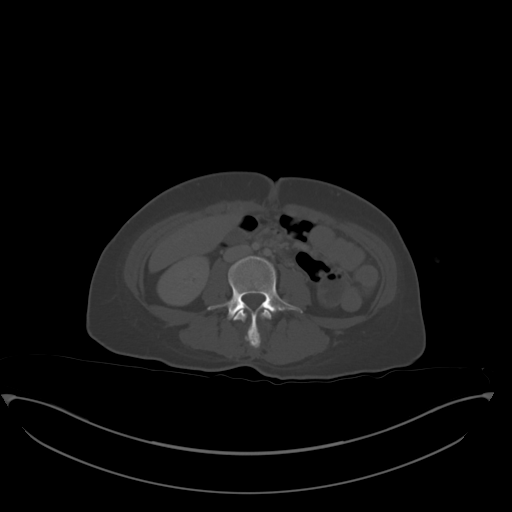
[im 56/76  soft-tissue]
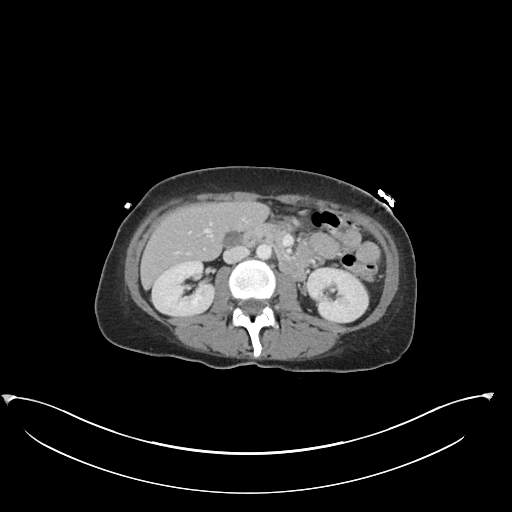
[im 60/76  soft-tissue]
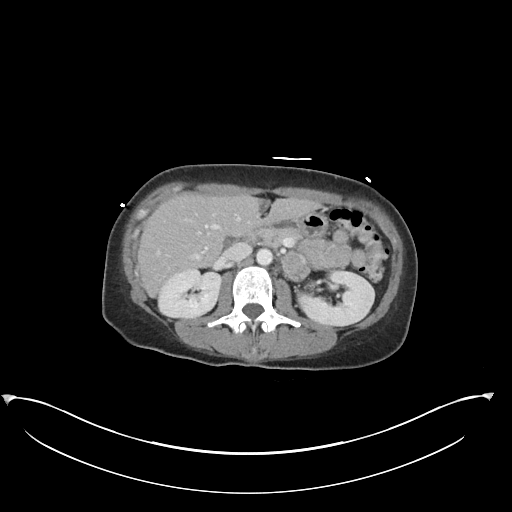
[im 64/76  soft-tissue]
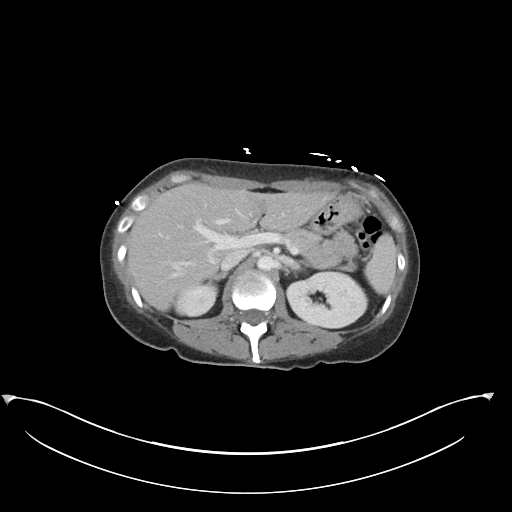
[im 72/76  soft-tissue]
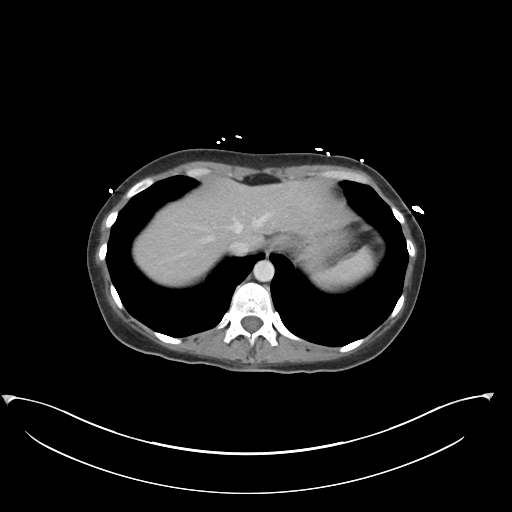

[Series 7: coronal soft tissue · coronal · 0.73mm/px · 3 of 74 slices shown]
[im 25/74  soft-tissue]
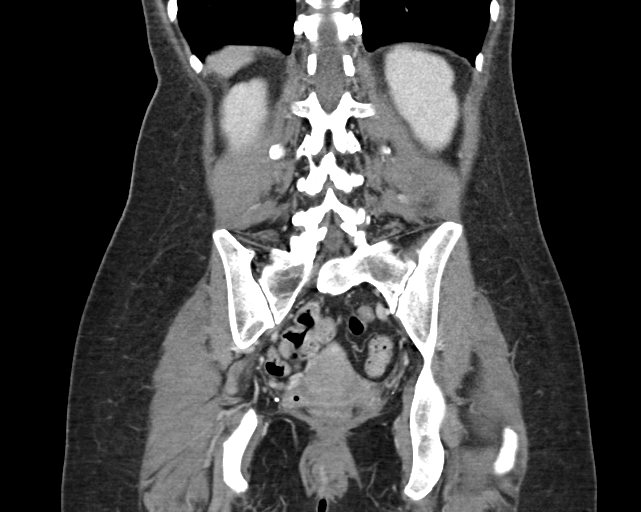
[im 33/74  soft-tissue]
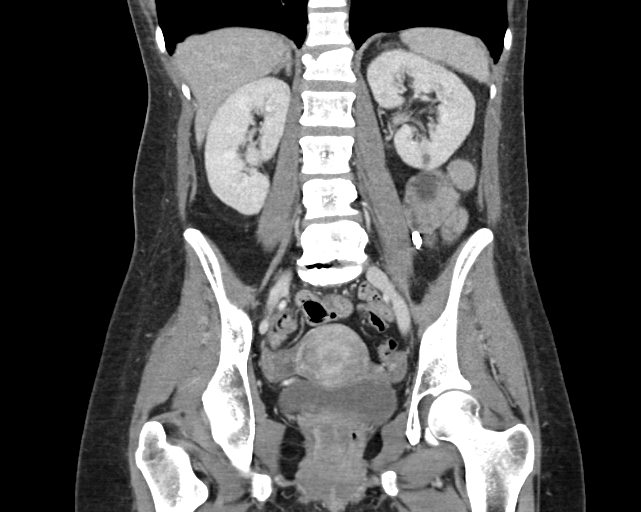
[im 41/74  soft-tissue]
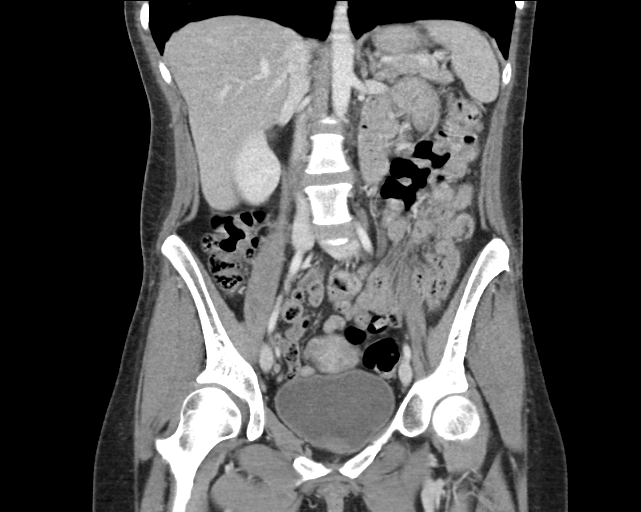

[16 of 46 positions shown; findings below may reference images not displayed]

FINDINGS: Lower chest:  No contributory findings.

Hepatobiliary: No focal liver abnormality.Vague dependent
gallbladder density without calcified stone or evidence of acute
cholecystitis.

Pancreas: Unremarkable.

Spleen: Unremarkable.

Adrenals/Urinary Tract: Negative adrenals. No hydronephrosis or
stone. Small renal cystic densities. Tiny accentuated vessels in the
left retroperitoneum, of doubtful clinical significance.
Unremarkable bladder.

Stomach/Bowel:  No obstruction. No visible bowel inflammation.

Vascular/Lymphatic: No acute vascular abnormality. No mass or
adenopathy.

Reproductive:No pathologic findings. Tubal ligation clips. The
right-sided clip has become mobile into the left lower quadrant.

Other: No ascites or pneumoperitoneum.

Musculoskeletal: No acute abnormalities. L5 chronic left pars defect
with accentuated disc degeneration and right foraminal impingement
from endplate spurring and disc bulging. Remote L1 superior endplate
fracture.
IMPRESSION: 1. No specific cause for symptoms.
2. Possible gallbladder sludge.
3. Chronic L5 pars defect with right foraminal impingement. Remote
L1 compression fracture.
